# Patient Record
Sex: Male | Born: 1960
Health system: Southern US, Community
[De-identification: ages and names within clinical notes are randomized; demographics above are authoritative.]

## PROBLEM LIST (undated history)

## (undated) DIAGNOSIS — G4730 Sleep apnea, unspecified: Secondary | ICD-10-CM

## (undated) DIAGNOSIS — I10 Essential (primary) hypertension: Secondary | ICD-10-CM

## (undated) DIAGNOSIS — E119 Type 2 diabetes mellitus without complications: Secondary | ICD-10-CM

## (undated) HISTORY — PX: COLONOSCOPY: SHX174

## (undated) HISTORY — DX: Sleep apnea, unspecified: G47.30

## (undated) HISTORY — DX: Essential (primary) hypertension: I10

---

## 2000-05-26 HISTORY — PX: APPENDECTOMY: SHX54

## 2001-04-19 ENCOUNTER — Emergency Department (HOSPITAL_COMMUNITY): Admission: EM | Admit: 2001-04-19 | Discharge: 2001-04-19 | Payer: Self-pay | Admitting: Emergency Medicine

## 2001-04-19 ENCOUNTER — Encounter: Payer: Self-pay | Admitting: Gastroenterology

## 2001-04-19 ENCOUNTER — Inpatient Hospital Stay (HOSPITAL_COMMUNITY): Admission: RE | Admit: 2001-04-19 | Discharge: 2001-04-27 | Payer: Self-pay | Admitting: Gastroenterology

## 2001-04-19 ENCOUNTER — Encounter: Payer: Self-pay | Admitting: Emergency Medicine

## 2010-10-03 ENCOUNTER — Other Ambulatory Visit: Payer: Self-pay | Admitting: Family Medicine

## 2010-10-03 DIAGNOSIS — R221 Localized swelling, mass and lump, neck: Secondary | ICD-10-CM

## 2010-10-04 ENCOUNTER — Ambulatory Visit
Admission: RE | Admit: 2010-10-04 | Discharge: 2010-10-04 | Disposition: A | Discharge status: Home / Self Care | Payer: BC Managed Care – PPO | Source: Ambulatory Visit | Attending: Family Medicine | Admitting: Family Medicine

## 2010-10-04 DIAGNOSIS — R221 Localized swelling, mass and lump, neck: Secondary | ICD-10-CM

## 2012-08-16 ENCOUNTER — Emergency Department (HOSPITAL_COMMUNITY)
Admission: EM | Admit: 2012-08-16 | Discharge: 2012-08-16 | Disposition: A | Discharge status: Home / Self Care | Payer: BC Managed Care – PPO | Attending: Emergency Medicine | Admitting: Emergency Medicine

## 2012-08-16 ENCOUNTER — Emergency Department (HOSPITAL_COMMUNITY): Payer: BC Managed Care – PPO

## 2012-08-16 ENCOUNTER — Encounter (HOSPITAL_COMMUNITY): Payer: Self-pay | Admitting: Emergency Medicine

## 2012-08-16 DIAGNOSIS — S239XXA Sprain of unspecified parts of thorax, initial encounter: Secondary | ICD-10-CM | POA: Insufficient documentation

## 2012-08-16 DIAGNOSIS — S39012A Strain of muscle, fascia and tendon of lower back, initial encounter: Secondary | ICD-10-CM

## 2012-08-16 DIAGNOSIS — S233XXA Sprain of ligaments of thoracic spine, initial encounter: Secondary | ICD-10-CM

## 2012-08-16 DIAGNOSIS — E119 Type 2 diabetes mellitus without complications: Secondary | ICD-10-CM | POA: Insufficient documentation

## 2012-08-16 DIAGNOSIS — S0990XA Unspecified injury of head, initial encounter: Secondary | ICD-10-CM | POA: Insufficient documentation

## 2012-08-16 DIAGNOSIS — Z79899 Other long term (current) drug therapy: Secondary | ICD-10-CM | POA: Insufficient documentation

## 2012-08-16 DIAGNOSIS — S335XXA Sprain of ligaments of lumbar spine, initial encounter: Secondary | ICD-10-CM | POA: Insufficient documentation

## 2012-08-16 DIAGNOSIS — Y92410 Unspecified street and highway as the place of occurrence of the external cause: Secondary | ICD-10-CM | POA: Insufficient documentation

## 2012-08-16 DIAGNOSIS — R51 Headache: Secondary | ICD-10-CM | POA: Insufficient documentation

## 2012-08-16 DIAGNOSIS — Y9389 Activity, other specified: Secondary | ICD-10-CM | POA: Insufficient documentation

## 2012-08-16 HISTORY — DX: Type 2 diabetes mellitus without complications: E11.9

## 2012-08-16 MED ORDER — OXYCODONE-ACETAMINOPHEN 5-325 MG PO TABS: 2.0000 | ORAL_TABLET | ORAL | Status: DC | PRN

## 2012-08-16 MED ORDER — DIAZEPAM 5 MG PO TABS: 5.0000 mg | ORAL_TABLET | Freq: Four times a day (QID) | ORAL | Status: DC | PRN
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
## 2013-02-14 ENCOUNTER — Encounter: Payer: Self-pay | Admitting: Internal Medicine

## 2013-04-14 ENCOUNTER — Ambulatory Visit (AMBULATORY_SURGERY_CENTER): Payer: Self-pay

## 2013-04-14 VITALS — Ht 74.0 in | Wt 298.0 lb

## 2013-04-14 DIAGNOSIS — Z1211 Encounter for screening for malignant neoplasm of colon: Secondary | ICD-10-CM

## 2013-04-14 MED ORDER — SUPREP BOWEL PREP KIT 17.5-3.13-1.6 GM/177ML PO SOLN: 1.0000 | Freq: Once | ORAL | Status: DC

## 2013-04-19 ENCOUNTER — Encounter: Payer: Self-pay | Admitting: Internal Medicine

## 2013-04-29 ENCOUNTER — Ambulatory Visit (AMBULATORY_SURGERY_CENTER): Payer: BC Managed Care – PPO | Admitting: Internal Medicine

## 2013-04-29 ENCOUNTER — Encounter: Payer: Self-pay | Admitting: Internal Medicine

## 2013-04-29 VITALS — BP 123/62 | HR 70 | Temp 97.5°F | Resp 16 | Ht 74.0 in | Wt 298.0 lb

## 2013-04-29 DIAGNOSIS — D126 Benign neoplasm of colon, unspecified: Secondary | ICD-10-CM

## 2013-04-29 DIAGNOSIS — Z1211 Encounter for screening for malignant neoplasm of colon: Secondary | ICD-10-CM

## 2013-04-29 DIAGNOSIS — N402 Nodular prostate without lower urinary tract symptoms: Secondary | ICD-10-CM

## 2013-04-29 MED ORDER — SODIUM CHLORIDE 0.9 % IV SOLN: 500.0000 mL | INTRAVENOUS | Status: DC

## 2013-04-29 NOTE — Op Note (Addendum)
[facility] [address] [place], [address]   COLONOSCOPY PROCEDURE REPORT  PATIENT: Blake Curtis, Blake Curtis  MR#: [id number] BIRTHDATE: [birth year] , 52  yrs. old GENDER: Male ENDOSCOPIST: Iva Boop, MD, [facility] REFERRED [id number] Jacky Kindle, M.D. PROCEDURE DATE:  04/29/2013 PROCEDURE:   Colonoscopy with snare polypectomy First Screening Colonoscopy - Avg.  risk and is 50 yrs.  old or older Yes.  Prior Negative Screening - Now for repeat screening. N/A  History of Adenoma - Now for follow-up colonoscopy & has been > or = to 3 yrs.  N/A  Polyps Removed Today? Yes. ASA CLASS:   Class II INDICATIONS:average risk screening and first colonoscopy. MEDICATIONS: propofol (Diprivan) 300mg  IV, MAC sedation, administered by CRNA, and These medications were titrated to patient response per physician's verbal order  DESCRIPTION OF PROCEDURE:   After the risks benefits and alternatives of the procedure were thoroughly explained, informed consent was obtained.  A digital rectal exam revealed a small inferior midiline nodule on the prostate.   The LB CF-H180AL Loaner [id number]  endoscope was introduced through the anus and advanced to the cecum, which was identified by both the appendix and ileocecal valve. No adverse events experienced.   The quality of the prep was excellent using Suprep  The instrument was then slowly withdrawn as the colon was fully examined.  COLON FINDINGS: A sessile polyp measuring 4 mm in size was found in the transverse colon.  A polypectomy was performed with a cold snare.  The resection was complete and the polyp tissue was completely retrieved.   The colon mucosa was otherwise normal.   A right colon retroflexion was performed.  Retroflexed views revealed no abnormalities. The time to cecum=2 minutes 17 seconds. Withdrawal time=13 minutes 51 seconds.  The scope was withdrawn and the procedure completed. COMPLICATIONS: There were no  complications.  ENDOSCOPIC IMPRESSION: 1.   Sessile polyp measuring 4 mm in size was found in the transverse colon; polypectomy was performed with a cold snare 2.   The colon mucosa was otherwise normal - excellent prep - first colonoscopy 3.   Small inferior midline prostate nodule  RECOMMENDATIONS: 1.  Timing of repeat colonoscopy will be determined by pathology findings. 2.   Have prostate evaluated by urology unless already done. Patoent will discuss with Dr. Jacky Kindle.   eSigned:  Iva Boop, MD, [facility] 04/29/2013 10:09 AM Revised: 04/29/2013 10:09 AM  cc: Geoffry Paradise, MD and The Patient

## 2013-04-29 NOTE — Progress Notes (Signed)
Patient did not experience any of the following events: a burn prior to discharge; a fall within the facility; wrong site/side/patient/procedure/implant event; or a hospital transfer or hospital admission upon discharge from the facility. (G8907) Patient did not have preoperative order for IV antibiotic SSI prophylaxis. (G8918)  

## 2013-04-29 NOTE — Progress Notes (Signed)
Procedure ends, to recovery, report given and VSS. 

## 2013-04-29 NOTE — Patient Instructions (Addendum)
I found and removed one small polyp that looks benign.  I felt a small nodule on your prostate that should be checked by a urologist. Please contact Dr. Jacky Kindle about this to see if he agrees and he will help further.  I will let you know pathology results and when to have another routine colonoscopy by mail.  I appreciate the opportunity to care for you. Iva Boop, MD, FACG  YOU HAD AN ENDOSCOPIC PROCEDURE TODAY AT THE [facility]: Refer to the procedure report that was given to you for any specific questions about what was found during the examination.  If the procedure report does not answer your questions, please call your gastroenterologist to clarify.  If you requested that your care partner not be given the details of your procedure findings, then the procedure report has been included in a sealed envelope for you to review at your convenience later.  YOU SHOULD EXPECT: Some feelings of bloating in the abdomen. Passage of more gas than usual.  Walking can help get rid of the air that was put into your GI tract during the procedure and reduce the bloating. If you had a lower endoscopy (such as a colonoscopy or flexible sigmoidoscopy) you may notice spotting of blood in your stool or on the toilet paper. If you underwent a bowel prep for your procedure, then you may not have a normal bowel movement for a few days.  DIET: Your first meal following the procedure should be a light meal and then it is ok to progress to your normal diet.  A half-sandwich or bowl of soup is an example of a good first meal.  Heavy or fried foods are harder to digest and may make you feel nauseous or bloated.  Likewise meals heavy in dairy and vegetables can cause extra gas to form and this can also increase the bloating.  Drink plenty of fluids but you should avoid alcoholic beverages for 24 hours.  ACTIVITY: Your care partner should take you home directly after the procedure.  You should plan to take  it easy, moving slowly for the rest of the day.  You can resume normal activity the day after the procedure however you should NOT DRIVE or use heavy machinery for 24 hours (because of the sedation medicines used during the test).    SYMPTOMS TO REPORT IMMEDIATELY: A gastroenterologist can be reached at any hour.  During normal business hours, 8:30 AM to 5:00 PM Monday through Friday, call [phone].  After hours and on weekends, please call the GI answering service at [phone] who will take a message and have the physician on call contact you.   Following lower endoscopy (colonoscopy or flexible sigmoidoscopy):  Excessive amounts of blood in the stool  Significant tenderness or worsening of abdominal pains  Swelling of the abdomen that is new, acute  Fever of 100F or higher   FOLLOW UP: If any biopsies were taken you will be contacted by phone or by letter within the next 1-3 weeks.  Call your gastroenterologist if you have not heard about the biopsies in 3 weeks.  Our staff will call the home number listed on your records the next business day following your procedure to check on you and address any questions or concerns that you may have at that time regarding the information given to you following your procedure. This is a courtesy call and so if there is no answer at the home number and we  have not heard from you through the emergency physician on call, we will assume that you have returned to your regular daily activities without incident.  SIGNATURES/CONFIDENTIALITY: You and/or your care partner have signed paperwork which will be entered into your electronic medical record.  These signatures attest to the fact that that the information above on your After Visit Summary has been reviewed and is understood.  Full responsibility of the confidentiality of this discharge information lies with you and/or your care-partner.  Polyp-handout given  Repeat colonoscopy will be  determined by pathology.  Have prostate evaluated by urology.

## 2013-04-29 NOTE — Progress Notes (Signed)
Called to room to assist during endoscopic procedure.  Patient ID and intended procedure confirmed with present staff. Received instructions for my participation in the procedure from the performing physician.  

## 2013-05-02 ENCOUNTER — Telehealth: Payer: Self-pay

## 2013-05-02 NOTE — Telephone Encounter (Signed)
  Follow up Call-  Call back number 04/29/2013  Post procedure Call Back phone  # [phone]  Permission to leave phone message Yes     Patient questions:  Do you have a fever, pain , or abdominal swelling? no Pain Score  0 *  Have you tolerated food without any problems? yes  Have you been able to return to your normal activities? yes  Do you have any questions about your discharge instructions: Diet   no Medications  no Follow up visit  no  Do you have questions or concerns about your Care? no  Actions: * If pain score is 4 or above: No action needed, pain <4.  No problems per the pt. maw

## 2013-05-06 ENCOUNTER — Encounter: Payer: Self-pay | Admitting: Internal Medicine

## 2013-12-14 ENCOUNTER — Encounter: Payer: Self-pay | Admitting: *Deleted

## 2016-05-27 DIAGNOSIS — E119 Type 2 diabetes mellitus without complications: Secondary | ICD-10-CM | POA: Diagnosis not present

## 2016-11-24 ENCOUNTER — Other Ambulatory Visit: Payer: Self-pay | Admitting: Internal Medicine

## 2016-11-24 DIAGNOSIS — E119 Type 2 diabetes mellitus without complications: Secondary | ICD-10-CM | POA: Diagnosis not present

## 2016-11-24 DIAGNOSIS — G4730 Sleep apnea, unspecified: Secondary | ICD-10-CM | POA: Diagnosis not present

## 2016-11-24 DIAGNOSIS — E669 Obesity, unspecified: Secondary | ICD-10-CM | POA: Diagnosis not present

## 2016-11-24 DIAGNOSIS — K210 Gastro-esophageal reflux disease with esophagitis: Secondary | ICD-10-CM | POA: Diagnosis not present

## 2016-11-24 DIAGNOSIS — R634 Abnormal weight loss: Secondary | ICD-10-CM | POA: Diagnosis not present

## 2016-11-24 DIAGNOSIS — I10 Essential (primary) hypertension: Secondary | ICD-10-CM | POA: Diagnosis not present

## 2016-11-24 DIAGNOSIS — R591 Generalized enlarged lymph nodes: Secondary | ICD-10-CM | POA: Diagnosis not present

## 2016-11-24 DIAGNOSIS — Z6837 Body mass index (BMI) 37.0-37.9, adult: Secondary | ICD-10-CM | POA: Diagnosis not present

## 2016-11-24 DIAGNOSIS — R221 Localized swelling, mass and lump, neck: Secondary | ICD-10-CM

## 2016-11-24 DIAGNOSIS — R5383 Other fatigue: Secondary | ICD-10-CM | POA: Diagnosis not present

## 2016-12-01 ENCOUNTER — Other Ambulatory Visit (HOSPITAL_COMMUNITY): Payer: Self-pay | Admitting: Internal Medicine

## 2016-12-01 ENCOUNTER — Ambulatory Visit
Admission: RE | Admit: 2016-12-01 | Discharge: 2016-12-01 | Disposition: A | Discharge status: Home / Self Care | Payer: 59 | Source: Ambulatory Visit | Attending: Internal Medicine | Admitting: Internal Medicine

## 2016-12-01 DIAGNOSIS — R221 Localized swelling, mass and lump, neck: Secondary | ICD-10-CM

## 2016-12-01 DIAGNOSIS — R918 Other nonspecific abnormal finding of lung field: Secondary | ICD-10-CM | POA: Diagnosis not present

## 2016-12-01 DIAGNOSIS — R634 Abnormal weight loss: Secondary | ICD-10-CM

## 2016-12-01 MED ORDER — IOPAMIDOL (ISOVUE-300) INJECTION 61%
75.0000 mL | Freq: Once | INTRAVENOUS | Status: AC | PRN
  Administered 2016-12-01: 75 mL via INTRAVENOUS

## 2016-12-08 ENCOUNTER — Other Ambulatory Visit: Payer: Self-pay | Admitting: Radiology

## 2016-12-09 ENCOUNTER — Other Ambulatory Visit: Payer: Self-pay | Admitting: General Surgery

## 2016-12-10 ENCOUNTER — Ambulatory Visit (HOSPITAL_COMMUNITY)
Admission: RE | Admit: 2016-12-10 | Discharge: 2016-12-10 | Disposition: A | Discharge status: Home / Self Care | Payer: 59 | Source: Ambulatory Visit | Attending: Internal Medicine | Admitting: Internal Medicine

## 2016-12-10 ENCOUNTER — Encounter (HOSPITAL_COMMUNITY): Payer: Self-pay

## 2016-12-10 DIAGNOSIS — G4730 Sleep apnea, unspecified: Secondary | ICD-10-CM | POA: Insufficient documentation

## 2016-12-10 DIAGNOSIS — Z888 Allergy status to other drugs, medicaments and biological substances status: Secondary | ICD-10-CM | POA: Diagnosis not present

## 2016-12-10 DIAGNOSIS — I10 Essential (primary) hypertension: Secondary | ICD-10-CM | POA: Diagnosis not present

## 2016-12-10 DIAGNOSIS — Z833 Family history of diabetes mellitus: Secondary | ICD-10-CM | POA: Insufficient documentation

## 2016-12-10 DIAGNOSIS — R590 Localized enlarged lymph nodes: Secondary | ICD-10-CM | POA: Diagnosis not present

## 2016-12-10 DIAGNOSIS — R591 Generalized enlarged lymph nodes: Secondary | ICD-10-CM | POA: Insufficient documentation

## 2016-12-10 DIAGNOSIS — R221 Localized swelling, mass and lump, neck: Secondary | ICD-10-CM

## 2016-12-10 DIAGNOSIS — E119 Type 2 diabetes mellitus without complications: Secondary | ICD-10-CM | POA: Insufficient documentation

## 2016-12-10 LAB — CBC
HEMATOCRIT: 39.8 % (ref 39.0–52.0)
HEMOGLOBIN: 13.4 g/dL (ref 13.0–17.0)
MCH: 29.4 pg (ref 26.0–34.0)
MCHC: 33.7 g/dL (ref 30.0–36.0)
MCV: 87.3 fL (ref 78.0–100.0)
Platelets: 179 10*3/uL (ref 150–400)
RBC: 4.56 MIL/uL (ref 4.22–5.81)
RDW: 12.7 % (ref 11.5–15.5)
WBC: 3.4 10*3/uL — ABNORMAL LOW (ref 4.0–10.5)

## 2016-12-10 LAB — PROTIME-INR
INR: 0.97
Prothrombin Time: 12.8 seconds (ref 11.4–15.2)

## 2016-12-10 LAB — GLUCOSE, CAPILLARY: Glucose-Capillary: 121 mg/dL — ABNORMAL HIGH (ref 65–99)

## 2016-12-10 LAB — APTT: aPTT: 37 seconds — ABNORMAL HIGH (ref 24–36)

## 2016-12-10 MED ORDER — SODIUM CHLORIDE 0.9 % IV SOLN: INTRAVENOUS | Status: DC

## 2016-12-10 MED ORDER — LIDOCAINE HCL (PF) 1 % IJ SOLN
INTRAMUSCULAR | Status: AC
  Filled 2016-12-10: qty 30

## 2016-12-10 NOTE — Progress Notes (Signed)
Chief Complaint: left suboccipital LAD  Referring Physician:Dr. Burnard Bunting  Supervising Physician: Daryll Brod  Patient Status: [facility] - Out-pt  HPI: Blake Curtis is a 56 y.o. male who found an enlarged LN on the posterior aspect of his head several months ago.  He saw his PCP who ordered CT scans of his head, neck, and chest.  These scans revealed generalized lymphadenopathy concerning for lymphoma.  A request for a LN BX has been made.  The patient presents today for this procedure.  Past Medical History:  Past Medical History:  Diagnosis Date  . Diabetes mellitus without complication (HCC)    diet controlled  . Hypertension   . Sleep apnea     Past Surgical History:  Past Surgical History:  Procedure Laterality Date  . APPENDECTOMY  2002    Family History:  Family History  Problem Relation Age of Onset  . Diabetes Mother   . Colon cancer Neg Hx   . Pancreatic cancer Neg Hx   . Stomach cancer Neg Hx     Social History:  reports that he has never smoked. He has never used smokeless tobacco. He reports that he does not drink alcohol or use drugs.  Allergies:  Allergies  Allergen Reactions  . Losartan Other (See Comments)    ED    Medications: Medications reviewed in epic  Please HPI for pertinent positives, otherwise complete 10 system ROS negative.  Mallampati Score: MD Evaluation Airway: WNL Heart: WNL Abdomen: WNL Chest/ Lungs: WNL ASA  Classification: 2 Mallampati/Airway Score: One  Physical Exam: BP 131/84   Pulse 66   Temp 98.2 F (36.8 C) (Oral)   Resp 16   Ht 6\' 2"  (1.88 m)   Wt 289 lb (131.1 kg)   SpO2 98%   BMI 37.11 kg/m  Body mass index is 37.11 kg/m. General: pleasant, WD, WN black male who is laying in bed in NAD HEENT: head is normocephalic, atraumatic.  Sclera are noninjected.  PERRL.  Ears and nose without any masses or lesions.  Mouth is pink and moist Heart: regular, rate, and rhythm.  Normal s1,s2. No obvious murmurs,  gallops, or rubs noted.  Palpable radial and pedal pulses bilaterally Lungs: CTAB, no wheezes, rhonchi, or rales noted.  Respiratory effort nonlabored Lymph: large palpable left suboccipital LN palpable Psych: A&Ox3 with an appropriate affect.   Labs: Results for orders placed or performed during the hospital encounter of 12/10/16 (from the past 48 hour(s))  APTT upon arrival     Status: Abnormal   Collection Time: 12/10/16  6:00 AM  Result Value Ref Range   aPTT 37 (H) 24 - 36 seconds    Comment:        IF BASELINE aPTT IS ELEVATED, SUGGEST PATIENT RISK ASSESSMENT BE USED TO DETERMINE APPROPRIATE ANTICOAGULANT THERAPY.   CBC upon arrival     Status: Abnormal   Collection Time: 12/10/16  6:00 AM  Result Value Ref Range   WBC 3.4 (L) 4.0 - 10.5 K/uL   RBC 4.56 4.22 - 5.81 MIL/uL   Hemoglobin 13.4 13.0 - 17.0 g/dL   HCT 39.8 39.0 - 52.0 %   MCV 87.3 78.0 - 100.0 fL   MCH 29.4 26.0 - 34.0 pg   MCHC 33.7 30.0 - 36.0 g/dL   RDW 12.7 11.5 - 15.5 %   Platelets 179 150 - 400 K/uL  Protime-INR upon arrival     Status: None   Collection Time: 12/10/16  6:00 AM  Result Value Ref Range   Prothrombin Time 12.8 11.4 - 15.2 seconds   INR 0.97   Glucose, capillary     Status: Abnormal   Collection Time: 12/10/16  6:14 AM  Result Value Ref Range   Glucose-Capillary 121 (H) 65 - 99 mg/dL    Imaging: No results found.  Assessment/Plan 1. Lymphadenopathy  We will plan to proceed today with a LN BX.  The patient has requested the procedure to be done without sedation.  This will be done.  His labs and vitals have been reviewed. Risks and Benefits discussed with the patient including, but not limited to bleeding, infection, damage to adjacent structures or low yield requiring additional tests. All of the patient's questions were answered, patient is agreeable to proceed. Consent signed and in chart.  Thank you for this interesting consult.  I greatly enjoyed meeting Blake Curtis and  look forward to participating in their care.  A copy of this report was sent to the requesting provider on this date.  Electronically Signed: Henreitta Cea 12/10/2016, 8:05 AM   I spent a total of  30 Minutes   in face to face in clinical consultation, greater than 50% of which was counseling/coordinating care for lymphadenopathy

## 2016-12-10 NOTE — Discharge Instructions (Addendum)
Needle Biopsy, Care After °Refer to this sheet in the next few weeks. These instructions provide you with information about caring for yourself after your procedure. Your health care provider may also give you more specific instructions. Your treatment has been planned according to current medical practices, but problems sometimes occur. Call your health care provider if you have any problems or questions after your procedure. °What can I expect after the procedure? °After your procedure, it is common to have soreness, bruising, or mild pain at the biopsy site. This should go away in a few days. °Follow these instructions at home: °· Rest as directed by your health care provider. °· Take medicines only as directed by your health care provider. °· There are many different ways to close and cover the biopsy site, including stitches (sutures), skin glue, and adhesive strips. Follow your health care provider's instructions about: °? Biopsy site care. °? Bandage (dressing) changes and removal. °? Biopsy site closure removal. °· Check your biopsy site every day for signs of infection. Watch for: °? Redness, swelling, or pain. °? Fluid, blood, or pus. °Contact a health care provider if: °· You have a fever. °· You have redness, swelling, or pain at the biopsy site that lasts longer than a few days. °· You have fluid, blood, or pus coming from the biopsy site. °· You feel nauseous. °· You vomit. °Get help right away if: °· You have shortness of breath. °· You have trouble breathing. °· You have chest pain. °· You feel dizzy or you faint. °· You have bleeding that does not stop with pressure or a bandage. °· You cough up blood. °· You have pain in your abdomen. °This information is not intended to replace advice given to you by your health care provider. Make sure you discuss any questions you have with your health care provider. °Document Released: 09/26/2014 Document Revised: 10/18/2015 Document Reviewed:  05/08/2014 °Elsevier Interactive Patient Education © 2018 [employer] °Moderate Conscious Sedation, Adult, Care After °These instructions provide you with information about caring for yourself after your procedure. Your health care provider may also give you more specific instructions. Your treatment has been planned according to current medical practices, but problems sometimes occur. Call your health care provider if you have any problems or questions after your procedure. °What can I expect after the procedure? °After your procedure, it is common: °· To feel sleepy for several hours. °· To feel clumsy and have poor balance for several hours. °· To have poor judgment for several hours. °· To vomit if you eat too soon. ° °Follow these instructions at home: °For at least 24 hours after the procedure: ° °· Do not: °? Participate in activities where you could fall or become injured. °? Drive. °? Use heavy machinery. °? Drink alcohol. °? Take sleeping pills or medicines that cause drowsiness. °? Make important decisions or sign legal documents. °? Take care of children on your own. °· Rest. °Eating and drinking °· Follow the diet recommended by your health care provider. °· If you vomit: °? Drink water, juice, or soup when you can drink without vomiting. °? Make sure you have little or no nausea before eating solid foods. °General instructions °· Have a responsible adult stay with you until you are awake and alert. °· Take over-the-counter and prescription medicines only as told by your health care provider. °· If you smoke, do not smoke without supervision. °· Keep all follow-up visits as told by your health care   provider. This is important. °Contact a health care provider if: °· You keep feeling nauseous or you keep vomiting. °· You feel light-headed. °· You develop a rash. °· You have a fever. °Get help right away if: °· You have trouble breathing. °This information is not intended to replace advice given to you  by your health care provider. Make sure you discuss any questions you have with your health care provider. °Document Released: 03/02/2013 Document Revised: 10/15/2015 Document Reviewed: 09/01/2015 °Elsevier Interactive Patient Education © 2018 [employer] ° °

## 2016-12-10 NOTE — Procedures (Signed)
cervcial adenopathy  S/p LT CERVICAL LN CORE BX  No comp Stable Path pending EBL 0 Full report in PACS

## 2016-12-15 ENCOUNTER — Telehealth: Payer: Self-pay | Admitting: Pulmonary Disease

## 2016-12-15 NOTE — Telephone Encounter (Signed)
Per BQ - change pt's consult appt to see BQ on 12/17/16 at 0900.   Appt change to 7.25.18 at 0900. Called and informed pt and advised him to arrive no later than 0830 (pt is already aware of our location). Pt verbalized understanding and denied any further questions or concerns at this time. BQ aware.

## 2016-12-16 ENCOUNTER — Institutional Professional Consult (permissible substitution): Payer: 59 | Admitting: Pulmonary Disease

## 2016-12-17 ENCOUNTER — Ambulatory Visit (INDEPENDENT_AMBULATORY_CARE_PROVIDER_SITE_OTHER): Payer: 59 | Admitting: Pulmonary Disease

## 2016-12-17 ENCOUNTER — Encounter: Payer: Self-pay | Admitting: Pulmonary Disease

## 2016-12-17 VITALS — BP 134/80 | HR 74 | Ht 74.0 in | Wt 282.0 lb

## 2016-12-17 DIAGNOSIS — R911 Solitary pulmonary nodule: Secondary | ICD-10-CM | POA: Diagnosis not present

## 2016-12-17 DIAGNOSIS — D869 Sarcoidosis, unspecified: Secondary | ICD-10-CM

## 2016-12-17 NOTE — Progress Notes (Signed)
Subjective:    Patient ID: Blake Curtis, male    DOB: 21-Apr-1961, 56 y.o.   MRN: [id number]  Synopsis: Referred in July 2018 for knee diagnosis of sarcoidosis.  HPI Chief Complaint  Patient presents with  . Advice Only    Referral for mediastinal lymphadenopathy.     Blake Curtis is here to see me because he was found to have enlarged lymph nodes.  He denies any symtpoms like dyspnea, pain, or fatigue at the time. He had lost some weight prior and this made the lymph node was more obvious in his neck because it was enlarged.    No family history of lung problems, he is a non-smoker.  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  He sometimes wears a mask.  He works with powders and fumes. He says that he does not wear a mask.  He has never smoked. He said that he lost the weight intentionally. He denies rash or joint pain.    Past Medical History:  Diagnosis Date  . Diabetes mellitus without complication (HCC)    diet controlled  . Hypertension   . Sleep apnea      Family History  Problem Relation Age of Onset  . Diabetes Mother   . Colon cancer Neg Hx   . Pancreatic cancer Neg Hx   . Stomach cancer Neg Hx      Social History   Social History  . Marital status: Married    Spouse name: N/A  . Number of children: N/A  . Years of education: N/A   Occupational History  . Not on file.   Social History Main Topics  . Smoking status: Never Smoker  . Smokeless tobacco: Never Used  . Alcohol use No  . Drug use: No  . Sexual activity: Not on file   Other Topics Concern  . Not on file   Social History Narrative   History of Smoking Cigarettes: Never Smoked   No Alcohol   No recreational drug use   Marital Status: Widowed. Wife died 2008-08-09 from ovarian cancer   Children: None     Allergies  Allergen Reactions  . Losartan Other (See Comments)    cough     Outpatient Medications Prior to Visit  Medication Sig Dispense Refill  . aspirin 81 MG  tablet Take 81 mg by mouth daily.    . metFORMIN (GLUCOPHAGE) 500 MG tablet Take 500 mg by mouth daily with breakfast.     No facility-administered medications prior to visit.       Review of Systems  Constitutional: Negative for fever and unexpected weight change.  HENT: Negative for congestion, dental problem, ear pain, nosebleeds, postnasal drip, rhinorrhea, sinus pressure, sneezing, sore throat and trouble swallowing.   Eyes: Negative for redness and itching.  Respiratory: Negative for cough, chest tightness, shortness of breath and wheezing.   Cardiovascular: Negative for palpitations and leg swelling.  Gastrointestinal: Negative for nausea and vomiting.  Genitourinary: Negative for dysuria.  Musculoskeletal: Negative for joint swelling.  Skin: Negative for rash.  Neurological: Negative for headaches.  Hematological: Does not bruise/bleed easily.  Psychiatric/Behavioral: Negative for dysphoric mood. The patient is not nervous/anxious.        Objective:   Physical Exam Vitals:   12/17/16 0852  BP: 134/80  Pulse: 74  SpO2: 100%  Weight: 282 lb (127.9 kg)  Height: 6\' 2"  (1.88 m)   Gen: well appearing, no acute distress HENT: NCAT,  OP clear, neck supple without masses Eyes: PERRL, EOMi Lymph: no cervical lymphadenopathy PULM: CTA B CV: RRR, no mgr, no JVD GI: BS+, soft, nontender, no hsm Derm: no rash or skin breakdown MSK: normal bulk and tone Neuro: A&Ox4, CN II-XII intact, strength 5/5 in all 4 extremities Psyche: normal mood and affect   Pathology: 12/10/2016 lymph node needle aspiration 9 Casey granulomas, special stains negative for organisms   Interventional radiology records reviewed: He was discovered to have an enlarged lymph node in the posterior aspect of his head several months prior to a biopsy which was performed on 12/10/2016 by Dr. Reesa Chew.  Chest imaging: 2018 CT chest images independently reviewed showing diffuse mediastinal and hilar  adenopathy, some small scattered pulmonary nodules but no pulmonary parenchymal abnormality    Assessment & Plan:  Sarcoidosis - Plan: EKG 12-Lead, Pulmonary function test, CT Chest Wo Contrast  Pulmonary nodule  Discussion: Blake Curtis is here today with a new diagnosis of sarcoidosis. He has lymphadenopathy, some small pulmonary nodules, and a biopsy which shows noncaseating granulomas without evidence of infection on special stain. There is no indication of a pulmonary fibrotic process though he has some small pulmonary nodules. Fortunately, he is asymptomatic so there is no reason to treat with prednisone right now.  We spent a significant amount of time counseling him today on the effects of sarcoidosis and discussing the underlying etiology and natural history. I explained to him that there is a 90+ percent chance that this will not worsen that we need to watch out for signs such as fevers, chills, shortness of breath, or unexplained cough. Also talked about rash, heart involvement and I involvement. Asking to see an ophthalmologist.  Plan: For your sarcoidosis: We will get a lung function test We will get a 12-lead EKG Let us know if you have or unexplained weight loss, fevers, chills, chest pain, cough, or shortness of breath We will see you back in one year and we'll repeat a CT scan and the lung function test Wear a mask when at work  We will see back in one year or sooner if needed   Current Outpatient Prescriptions:  .  aspirin 81 MG tablet, Take 81 mg by mouth daily., Disp: , Rfl:  .  metFORMIN (GLUCOPHAGE) 500 MG tablet, Take 500 mg by mouth daily with breakfast., Disp: , Rfl:

## 2016-12-17 NOTE — Patient Instructions (Signed)
For your sarcoidosis: We will get a lung function test We will get a 12-lead EKG Let us know if you have or unexplained weight loss, fevers, chills, chest pain, cough, or shortness of breath We will see you back in one year and we'll repeat a CT scan and the lung function test Wear a mask when at work  We will see back in one year or sooner if needed

## 2017-01-01 ENCOUNTER — Ambulatory Visit (INDEPENDENT_AMBULATORY_CARE_PROVIDER_SITE_OTHER): Payer: 59 | Admitting: Pulmonary Disease

## 2017-01-01 DIAGNOSIS — D869 Sarcoidosis, unspecified: Secondary | ICD-10-CM | POA: Diagnosis not present

## 2017-01-01 LAB — PULMONARY FUNCTION TEST
DL/VA % pred: 77 %
DL/VA: 3.76 ml/min/mmHg/L
DLCO COR: 24.85 ml/min/mmHg
DLCO cor % pred: 65 %
DLCO unc % pred: 63 %
DLCO unc: 24.04 ml/min/mmHg
FEF 25-75 Post: 3.19 L/sec
FEF 25-75 Pre: 3.69 L/sec
FEF2575-%CHANGE-POST: -13 %
FEF2575-%PRED-POST: 87 %
FEF2575-%Pred-Pre: 101 %
FEV1-%CHANGE-POST: -6 %
FEV1-%PRED-PRE: 86 %
FEV1-%Pred-Post: 80 %
FEV1-PRE: 3.72 L
FEV1-Post: 3.48 L
FEV1FVC-%CHANGE-POST: -10 %
FEV1FVC-%Pred-Pre: 103 %
FEV6-%Change-Post: 1 %
FEV6-%PRED-PRE: 85 %
FEV6-%Pred-Post: 86 %
FEV6-PRE: 4.62 L
FEV6-Post: 4.68 L
FEV6FVC-%Change-Post: 1 %
FEV6FVC-%PRED-PRE: 102 %
FEV6FVC-%Pred-Post: 104 %
FVC-%Change-Post: 4 %
FVC-%PRED-POST: 86 %
FVC-%PRED-PRE: 83 %
FVC-POST: 4.89 L
FVC-PRE: 4.68 L
POST FEV6/FVC RATIO: 100 %
Post FEV1/FVC ratio: 71 %
Pre FEV1/FVC ratio: 80 %
Pre FEV6/FVC Ratio: 99 %
RV % PRED: 80 %
RV: 1.9 L
TLC % pred: 87 %
TLC: 6.8 L

## 2017-01-01 NOTE — Progress Notes (Signed)
PFT done today. 

## 2017-01-06 ENCOUNTER — Telehealth: Payer: Self-pay | Admitting: Pulmonary Disease

## 2017-01-06 NOTE — Telephone Encounter (Signed)
Called and spoke to pt's wife, Velva Harman. She is requesting the results of pt's PFT results from 8.9.2018. Velva Harman is aware that BQ is out of the office until 01/19/17 and if ok waiting for results until BQ returns.   Dr. Lake Bells please advise. Thanks.

## 2017-01-09 NOTE — Telephone Encounter (Signed)
Pt's wife is aware of MW's below message and voiced her understanding. Nothing further needed.

## 2017-01-09 NOTE — Telephone Encounter (Signed)
Really nothing of significance - very minor abnormalities that could be due to sarcoid but as per Dr Lake Bells no indication for treatment or change from what Dr Lake Bells already told him and will likely say the same thing when he reviews it himself

## 2017-01-09 NOTE — Telephone Encounter (Signed)
Called and spoke with Velva Harman and she stated that if someone else could look at the PFT so they can know if everything is ok.  Will forward to MW to review this morning.  Thanks

## 2017-01-19 ENCOUNTER — Telehealth: Payer: Self-pay | Admitting: Pulmonary Disease

## 2017-01-19 DIAGNOSIS — D869 Sarcoidosis, unspecified: Secondary | ICD-10-CM

## 2017-01-19 NOTE — Telephone Encounter (Signed)
I discussed the results of his PFT with his wife which showed reduced diffusion capacity.  I would like for him to have an echo, will cc triage to facilitate.

## 2017-01-19 NOTE — Addendum Note (Signed)
Addended by: Len Blalock on: 01/19/2017 04:59 PM   Modules accepted: Orders

## 2017-01-19 NOTE — Telephone Encounter (Signed)
Echo ordered.  Nothing further needed.

## 2017-01-28 ENCOUNTER — Other Ambulatory Visit (HOSPITAL_COMMUNITY): Payer: 59

## 2017-03-04 ENCOUNTER — Telehealth: Payer: Self-pay | Admitting: Pulmonary Disease

## 2017-03-04 NOTE — Telephone Encounter (Signed)
Spoke with wife, and advised her to call [facility]. Gave address and phone number for her to call and reschedule at their convenience. Nothing further is needed.

## 2017-03-09 ENCOUNTER — Telehealth: Payer: Self-pay | Admitting: Pulmonary Disease

## 2017-03-09 DIAGNOSIS — I10 Essential (primary) hypertension: Secondary | ICD-10-CM

## 2017-03-09 DIAGNOSIS — D869 Sarcoidosis, unspecified: Secondary | ICD-10-CM

## 2017-03-09 NOTE — Telephone Encounter (Signed)
Spoke with Blake Curtis. She stated that [facility] will need a new referral since the original order has now expired.   Advised her that I would place another order. She verbalized understanding. Nothing further needed at time of call.

## 2017-03-20 ENCOUNTER — Other Ambulatory Visit (HOSPITAL_COMMUNITY): Payer: 59

## 2017-03-26 DIAGNOSIS — Z125 Encounter for screening for malignant neoplasm of prostate: Secondary | ICD-10-CM | POA: Diagnosis not present

## 2017-03-26 DIAGNOSIS — E119 Type 2 diabetes mellitus without complications: Secondary | ICD-10-CM | POA: Diagnosis not present

## 2017-03-26 DIAGNOSIS — Z0000 Encounter for general adult medical examination without abnormal findings: Secondary | ICD-10-CM | POA: Diagnosis not present

## 2017-04-01 DIAGNOSIS — Z1389 Encounter for screening for other disorder: Secondary | ICD-10-CM | POA: Diagnosis not present

## 2017-04-01 DIAGNOSIS — E119 Type 2 diabetes mellitus without complications: Secondary | ICD-10-CM | POA: Diagnosis not present

## 2017-04-01 DIAGNOSIS — I10 Essential (primary) hypertension: Secondary | ICD-10-CM | POA: Diagnosis not present

## 2017-04-01 DIAGNOSIS — E669 Obesity, unspecified: Secondary | ICD-10-CM | POA: Diagnosis not present

## 2017-04-01 DIAGNOSIS — K210 Gastro-esophageal reflux disease with esophagitis: Secondary | ICD-10-CM | POA: Diagnosis not present

## 2017-04-01 DIAGNOSIS — M545 Low back pain: Secondary | ICD-10-CM | POA: Diagnosis not present

## 2017-04-01 DIAGNOSIS — D869 Sarcoidosis, unspecified: Secondary | ICD-10-CM | POA: Diagnosis not present

## 2017-04-01 DIAGNOSIS — Z0000 Encounter for general adult medical examination without abnormal findings: Secondary | ICD-10-CM | POA: Diagnosis not present

## 2017-04-01 DIAGNOSIS — G4730 Sleep apnea, unspecified: Secondary | ICD-10-CM | POA: Diagnosis not present

## 2017-04-01 DIAGNOSIS — N401 Enlarged prostate with lower urinary tract symptoms: Secondary | ICD-10-CM | POA: Diagnosis not present

## 2017-04-23 ENCOUNTER — Ambulatory Visit (HOSPITAL_COMMUNITY): Payer: 59 | Attending: Internal Medicine

## 2017-04-23 ENCOUNTER — Other Ambulatory Visit: Payer: Self-pay

## 2017-04-23 DIAGNOSIS — I10 Essential (primary) hypertension: Secondary | ICD-10-CM | POA: Insufficient documentation

## 2017-04-23 DIAGNOSIS — D869 Sarcoidosis, unspecified: Secondary | ICD-10-CM | POA: Diagnosis not present

## 2017-04-24 NOTE — Progress Notes (Signed)
Spoke with patient and made him aware of results. He verbalized understanding and did not have any questions. Nothing further is needed.

## 2017-05-07 MED FILL — TAMSULOSIN HCL 0.4 MG CAP: 0.4 | 30 days supply | Qty: 30 | Fill #0

## 2017-05-12 MED FILL — metFORMIN HCL 500 MG TABS: 500 | 90 days supply | Qty: 90 | Fill #0

## 2017-06-17 MED FILL — TAMSULOSIN HCL 0.4 MG CAP: 0.4 | 30 days supply | Qty: 30 | Fill #1

## 2017-08-03 DIAGNOSIS — E119 Type 2 diabetes mellitus without complications: Secondary | ICD-10-CM | POA: Diagnosis not present

## 2017-08-03 DIAGNOSIS — I10 Essential (primary) hypertension: Secondary | ICD-10-CM | POA: Diagnosis not present

## 2017-08-03 DIAGNOSIS — K210 Gastro-esophageal reflux disease with esophagitis: Secondary | ICD-10-CM | POA: Diagnosis not present

## 2017-08-03 DIAGNOSIS — M545 Low back pain: Secondary | ICD-10-CM | POA: Diagnosis not present

## 2017-08-03 DIAGNOSIS — Z1389 Encounter for screening for other disorder: Secondary | ICD-10-CM | POA: Diagnosis not present

## 2017-08-03 DIAGNOSIS — D869 Sarcoidosis, unspecified: Secondary | ICD-10-CM | POA: Diagnosis not present

## 2017-08-03 DIAGNOSIS — E669 Obesity, unspecified: Secondary | ICD-10-CM | POA: Diagnosis not present

## 2017-08-03 DIAGNOSIS — G4730 Sleep apnea, unspecified: Secondary | ICD-10-CM | POA: Diagnosis not present

## 2017-08-03 DIAGNOSIS — N401 Enlarged prostate with lower urinary tract symptoms: Secondary | ICD-10-CM | POA: Diagnosis not present

## 2017-08-03 MED FILL — TAMSULOSIN HCL 0.4 MG CAP: 0.4 | 30 days supply | Qty: 30 | Fill #2

## 2017-08-04 MED FILL — FINASTERIDE 5 MG TABLET: 5 | 30 days supply | Qty: 30 | Fill #0

## 2017-09-11 MED FILL — FINASTERIDE 5 MG TABLET: 5 | 30 days supply | Qty: 30 | Fill #1

## 2017-09-11 MED FILL — TAMSULOSIN HCL 0.4 MG CAP: 0.4 | 30 days supply | Qty: 30 | Fill #3

## 2017-11-24 ENCOUNTER — Ambulatory Visit (INDEPENDENT_AMBULATORY_CARE_PROVIDER_SITE_OTHER)
Admission: RE | Admit: 2017-11-24 | Discharge: 2017-11-24 | Disposition: A | Discharge status: Home / Self Care | Payer: 59 | Source: Ambulatory Visit | Attending: Pulmonary Disease | Admitting: Pulmonary Disease

## 2017-11-24 DIAGNOSIS — D869 Sarcoidosis, unspecified: Secondary | ICD-10-CM | POA: Diagnosis not present

## 2017-11-24 DIAGNOSIS — R918 Other nonspecific abnormal finding of lung field: Secondary | ICD-10-CM | POA: Diagnosis not present

## 2017-11-24 NOTE — Progress Notes (Signed)
LMTCB

## 2017-12-03 MED FILL — TAMSULOSIN HCL 0.4 MG CAP: 0.4 | 30 days supply | Qty: 30 | Fill #4

## 2017-12-03 MED FILL — FINASTERIDE 5 MG TABLET: 5 | 30 days supply | Qty: 30 | Fill #2

## 2017-12-18 ENCOUNTER — Ambulatory Visit: Payer: 59 | Admitting: Pulmonary Disease

## 2018-01-12 ENCOUNTER — Other Ambulatory Visit: Payer: 59

## 2018-01-12 ENCOUNTER — Ambulatory Visit (INDEPENDENT_AMBULATORY_CARE_PROVIDER_SITE_OTHER): Payer: 59 | Admitting: Pulmonary Disease

## 2018-01-12 ENCOUNTER — Encounter: Payer: Self-pay | Admitting: Pulmonary Disease

## 2018-01-12 VITALS — BP 138/90 | HR 84 | Ht 72.64 in | Wt 290.0 lb

## 2018-01-12 DIAGNOSIS — R591 Generalized enlarged lymph nodes: Secondary | ICD-10-CM

## 2018-01-12 DIAGNOSIS — M542 Cervicalgia: Secondary | ICD-10-CM | POA: Diagnosis not present

## 2018-01-12 DIAGNOSIS — D869 Sarcoidosis, unspecified: Secondary | ICD-10-CM

## 2018-01-12 MED ORDER — HYDROXYCHLOROQUINE SULFATE 200 MG PO TABS: 200.0000 mg | ORAL_TABLET | Freq: Every day | ORAL | 0 refills | Status: DC

## 2018-01-12 NOTE — Patient Instructions (Signed)
Lymphadenopathy due to sarcoidosis causing neck pain: On days when the pain is more severe use warm compresses and naproxen over-the-counter Take Plaquenil 200 mg daily for the next 3 months Prior to starting Plaquenil we need to check one simple blood test, do not fill the prescription until you have heard the results from this  Pulmonary sarcoidosis: We will check a lung function test  Follow-up in 3 months or sooner if needed

## 2018-01-12 NOTE — Progress Notes (Signed)
Subjective:    Patient ID: Blake Curtis, male    DOB: [birth year], 57 y.o.   MRN: [id number]  Synopsis: Referred in July 2018 for new diagnosis of sarcoidosis.  HPI Chief Complaint  Patient presents with  . Follow-up    pt reports neck pain   [name] comes back for follow-up today with his known diagnosis of pulmonary sarcoidosis: He says that he has not had any problems breathing in the last year.  No bronchitis, no pneumonia, no problems with shortness of breath.  No cough, no chest tightness.  However, he says that he has neck pain on a day-to-day basis which she attributes to the "lumps" in his neck.  Specifically he says the lymphadenopathy is quite painful, typically worse in the mornings.  Is worse when he turns his neck.  He denies any weakness in his hands or numbness.  He does not have neck pain apart from this.  He says that warm compresses help.  Naproxen helps.   Past Medical History:  Diagnosis Date  . Diabetes mellitus without complication (HCC)    diet controlled  . Hypertension   . Sleep apnea      Family History  Problem Relation Age of Onset  . Diabetes Mother   . Colon cancer Neg Hx   . Pancreatic cancer Neg Hx   . Stomach cancer Neg Hx        Review of Systems  Constitutional: Negative for fever and unexpected weight change.  HENT: Negative for congestion, dental problem, ear pain, nosebleeds, postnasal drip, rhinorrhea, sinus pressure, sneezing, sore throat and trouble swallowing.   Eyes: Negative for redness and itching.  Respiratory: Negative for cough, chest tightness, shortness of breath and wheezing.   Cardiovascular: Negative for palpitations and leg swelling.  Gastrointestinal: Negative for nausea and vomiting.  Genitourinary: Negative for dysuria.  Musculoskeletal: Negative for joint swelling.  Skin: Negative for rash.  Neurological: Negative for headaches.  Hematological: Does not bruise/bleed easily.  Psychiatric/Behavioral: Negative for  dysphoric mood. The patient is not nervous/anxious.        Objective:   Physical Exam Vitals:   01/12/18 1644  BP: 138/90  Pulse: 84  SpO2: 100%  Weight: 290 lb (131.5 kg)  Height: 6' 0.64" (1.845 m)    Gen: well appearing HENT: OP clear, TM's clear, neck with occipital and posterior cervical lymphadenopathy PULM: CTA B, normal percussion CV: RRR, no mgr, trace edema GI: BS+, soft, nontender Derm: no cyanosis or rash Psyche: normal mood and affect    Pathology: 12/10/2016 lymph node needle aspiration non-caseating granulomas, special stains negative for organisms  PFT:  August 2018 ratio normal, FVC 4.89 L 86% predicted, total lung capacity 6.80 L 87% predicted, DLCO 24.0 463%  Interventional radiology records reviewed: He was discovered to have an enlarged lymph node in the posterior aspect of his head several months prior to a biopsy which was performed on 12/10/2016 by Dr. Reesa Chew.  Chest imaging: 2018 CT chest images independently reviewed showing diffuse mediastinal and hilar adenopathy, some small scattered pulmonary nodules but no pulmonary parenchymal abnormality 2019 CT chest images independently reviewed showing stable scattered small nodules in both lungs, adenopathy stable    Assessment & Plan:  Sarcoidosis - Plan: Pulmonary function test, Glucose 6 phosphate dehydrogenase  Neck pain  Lymphadenopathy Discussion: Virgilio states that the lymphadenopathy in his neck is his biggest problem.  He has palpable lymphadenopathy on exam which we know is due to his sarcoidosis based on the  biopsy from a year ago.  Fortunately there does not seem to be evidence of otherwise significant organ threatening or life-threatening sarcoidosis.  I would like to get another lung function test to make sure that the small nodules that were seen earlier this year or not reflective of worsening parenchymal disease, I doubt it based on his lack of symptoms.  Given the fact that he has  day-to-day bothersome symptoms from his lymphadenopathy I think it is appropriate to treat him.  He is not interested in steroids which I think is reasonable considering his obesity and the likelihood of this causing more problems with long-term use.  He is also not interested in taking long-term NSAIDs because of the side effects.  So after lengthy discussion I think the best approach is to start him on a low-dose of Plaquenil.  I would like to treat him with this for about 3 months and then reassess its need.  Plan: Lymphadenopathy due to sarcoidosis causing neck pain: On days when the pain is more severe use warm compresses and naproxen over-the-counter Take Plaquenil 200 mg daily for the next 3 months Prior to starting Plaquenil we need to check G6PD testing, do not fill the prescription until you have heard the results from this  Pulmonary sarcoidosis: We will check a lung function test  Follow-up in 3 months or sooner if needed   Current Outpatient Medications:  .  aspirin 81 MG tablet, Take 81 mg by mouth daily., Disp: , Rfl:  .  metFORMIN (GLUCOPHAGE) 500 MG tablet, Take 500 mg by mouth daily with breakfast., Disp: , Rfl:  .  tamsulosin (FLOMAX) 0.4 MG CAPS capsule, Take 0.4 mg by mouth daily., Disp: , Rfl: 5 .  hydroxychloroquine (PLAQUENIL) 200 MG tablet, Take 1 tablet (200 mg total) by mouth daily., Disp: 2 tablet, Rfl: 0

## 2018-01-13 LAB — GLUCOSE 6 PHOSPHATE DEHYDROGENASE: G-6PDH: 13.7 U/g Hgb (ref 7.0–20.5)

## 2018-01-14 ENCOUNTER — Telehealth: Payer: Self-pay | Admitting: Pulmonary Disease

## 2018-01-14 NOTE — Telephone Encounter (Signed)
Called and spoke to patient. Relayed results per Dr. Lake Bells. Patient verbalized understanding. Nothing further needed.    Result Notes for Glucose 6 phosphate dehydrogenase   Notes recorded by Juanito Doom, MD on 01/14/2018 at 4:13 PM EDT BJ, Please let the patient know this was OK, he can start taking plaquenil Thanks, B

## 2018-01-18 ENCOUNTER — Telehealth: Payer: Self-pay | Admitting: Pulmonary Disease

## 2018-01-18 NOTE — Telephone Encounter (Signed)
   Dr. Lake Bells this medication is taken once daily? I just want to verify before I sent in. I spoke with pt and advised him that I wanted to verify the quantity. BQ please advise.    hydroxychloroquine (PLAQUENIL) 200 MG tablet [[id number]]    Order Details  Dose: 200 mg Route: Oral Frequency: Daily  Dispense Quantity: 2 tablet Refills: 0 Fills remaining: --        Sig: Take 1 tablet (200 mg total) by mouth daily.       Written Date: 01/12/18 Expiration Date: 01/12/19    Start Date: 01/12/18 End Date: 02/11/18 after 30 doses         Ordering Provider: -- DEA #:  -- NPI:  --   Authorizing Provider: Juanito Doom, MD DEA #:  [id number] NPI:  [id number]   Ordering User:  Juanito Doom, MD            Pharmacy:  [place], [place] - [address] DEA #:  --    Pharmacy Comments:  --       Fill quantity remaining:  -- Fill quantity used:  --

## 2018-01-20 MED ORDER — HYDROXYCHLOROQUINE SULFATE 200 MG PO TABS: 200.0000 mg | ORAL_TABLET | Freq: Every day | ORAL | 2 refills | Status: AC

## 2018-01-20 MED FILL — HYDROXYCHLOROQUINE SULFATE: 200 | 30 days supply | Qty: 30 | Fill #0

## 2018-01-20 NOTE — Telephone Encounter (Signed)
Medication has been sent in. Will close this encounter.  

## 2018-01-20 NOTE — Telephone Encounter (Signed)
Yes 200 mg daily

## 2018-02-01 MED FILL — TAMSULOSIN HCL 0.4 MG CAP: 0.4 | 30 days supply | Qty: 30 | Fill #5

## 2018-03-10 ENCOUNTER — Encounter: Payer: Self-pay | Admitting: Pulmonary Disease

## 2018-03-10 ENCOUNTER — Ambulatory Visit (INDEPENDENT_AMBULATORY_CARE_PROVIDER_SITE_OTHER): Payer: 59 | Admitting: Pulmonary Disease

## 2018-03-10 ENCOUNTER — Other Ambulatory Visit (INDEPENDENT_AMBULATORY_CARE_PROVIDER_SITE_OTHER): Payer: 59

## 2018-03-10 VITALS — BP 138/86 | HR 88

## 2018-03-10 DIAGNOSIS — R591 Generalized enlarged lymph nodes: Secondary | ICD-10-CM

## 2018-03-10 DIAGNOSIS — D869 Sarcoidosis, unspecified: Secondary | ICD-10-CM | POA: Diagnosis not present

## 2018-03-10 LAB — PULMONARY FUNCTION TEST
DL/VA % pred: 80 %
DL/VA: 3.89 ml/min/mmHg/L
DLCO UNC % PRED: 72 %
DLCO unc: 26.75 ml/min/mmHg
FEF 25-75 PRE: 4.3 L/s
FEF 25-75 Post: 4.96 L/sec
FEF2575-%CHANGE-POST: 15 %
FEF2575-%Pred-Post: 140 %
FEF2575-%Pred-Pre: 121 %
FEV1-%Change-Post: 3 %
FEV1-%PRED-POST: 101 %
FEV1-%Pred-Pre: 98 %
FEV1-POST: 4.25 L
FEV1-PRE: 4.12 L
FEV1FVC-%CHANGE-POST: 2 %
FEV1FVC-%Pred-Pre: 106 %
FEV6-%CHANGE-POST: 0 %
FEV6-%PRED-POST: 95 %
FEV6-%Pred-Pre: 95 %
FEV6-POST: 5.05 L
FEV6-Pre: 5.02 L
FEV6FVC-%Change-Post: 0 %
FEV6FVC-%PRED-POST: 103 %
FEV6FVC-%PRED-PRE: 103 %
FVC-%CHANGE-POST: 0 %
FVC-%Pred-Post: 92 %
FVC-%Pred-Pre: 92 %
FVC-Post: 5.07 L
FVC-Pre: 5.05 L
POST FEV6/FVC RATIO: 100 %
PRE FEV1/FVC RATIO: 81 %
Post FEV1/FVC ratio: 84 %
Pre FEV6/FVC Ratio: 99 %
RV % PRED: 79 %
RV: 1.85 L
TLC % PRED: 89 %
TLC: 6.82 L

## 2018-03-10 LAB — CBC WITH DIFFERENTIAL/PLATELET
Basophils Absolute: 0 10*3/uL (ref 0.0–0.1)
Basophils Relative: 0.6 % (ref 0.0–3.0)
EOS PCT: 5 % (ref 0.0–5.0)
Eosinophils Absolute: 0.2 10*3/uL (ref 0.0–0.7)
HEMATOCRIT: 42.3 % (ref 39.0–52.0)
HEMOGLOBIN: 14.5 g/dL (ref 13.0–17.0)
LYMPHS PCT: 34.4 % (ref 12.0–46.0)
Lymphs Abs: 1.2 10*3/uL (ref 0.7–4.0)
MCHC: 34.4 g/dL (ref 30.0–36.0)
MCV: 89.6 fl (ref 78.0–100.0)
MONO ABS: 0.4 10*3/uL (ref 0.1–1.0)
Monocytes Relative: 12.6 % — ABNORMAL HIGH (ref 3.0–12.0)
Neutro Abs: 1.7 10*3/uL (ref 1.4–7.7)
Neutrophils Relative %: 47.4 % (ref 43.0–77.0)
Platelets: 168 10*3/uL (ref 150.0–400.0)
RBC: 4.73 Mil/uL (ref 4.22–5.81)
RDW: 12.6 % (ref 11.5–15.5)
WBC: 3.5 10*3/uL — AB (ref 4.0–10.5)

## 2018-03-10 LAB — COMPREHENSIVE METABOLIC PANEL
ALT: 18 U/L (ref 0–53)
AST: 20 U/L (ref 0–37)
Albumin: 4.4 g/dL (ref 3.5–5.2)
Alkaline Phosphatase: 74 U/L (ref 39–117)
BUN: 15 mg/dL (ref 6–23)
CO2: 29 meq/L (ref 19–32)
Calcium: 9.7 mg/dL (ref 8.4–10.5)
Chloride: 100 mEq/L (ref 96–112)
Creatinine, Ser: 1.14 mg/dL (ref 0.40–1.50)
GFR: 85.59 mL/min (ref >60.00)
GLUCOSE: 151 mg/dL — AB (ref 70–99)
POTASSIUM: 3.9 meq/L (ref 3.5–5.1)
SODIUM: 137 meq/L (ref 135–145)
Total Bilirubin: 0.6 mg/dL (ref 0.2–1.2)
Total Protein: 7.5 g/dL (ref 6.0–8.3)

## 2018-03-10 NOTE — Progress Notes (Signed)
Subjective:    Patient ID: Blake Curtis, male    DOB: 06-08-1960, 57 y.o.   MRN: [id number]  Synopsis: Referred in July 2018 for new diagnosis of sarcoidosis.  HPI Chief Complaint  Patient presents with  . Follow-up    PFT today   Blake Curtis has been doing well since the last visit.  He says that his lymphadenopathy in his neck is improved and he no longer has discomfort there.  He says that he has not had to take anything for that right now as before when he was having to take NSAIDs fairly frequently.  He had to take the Plaquenil in the evenings because he said if he took it during the daytime it would make him jittery.  No problems with cough or shortness of breath   Past Medical History:  Diagnosis Date  . Diabetes mellitus without complication (HCC)    diet controlled  . Hypertension   . Sleep apnea      Family History  Problem Relation Age of Onset  . Diabetes Mother   . Colon cancer Neg Hx   . Pancreatic cancer Neg Hx   . Stomach cancer Neg Hx        Review of Systems  Constitutional: Negative for fever and unexpected weight change.  HENT: Negative for congestion, dental problem, ear pain, nosebleeds, postnasal drip, rhinorrhea, sinus pressure, sneezing, sore throat and trouble swallowing.   Eyes: Negative for redness and itching.  Respiratory: Negative for cough, chest tightness, shortness of breath and wheezing.   Cardiovascular: Negative for palpitations and leg swelling.  Gastrointestinal: Negative for nausea and vomiting.  Genitourinary: Negative for dysuria.  Musculoskeletal: Negative for joint swelling.  Skin: Negative for rash.  Neurological: Negative for headaches.  Hematological: Does not bruise/bleed easily.  Psychiatric/Behavioral: Negative for dysphoric mood. The patient is not nervous/anxious.        Objective:   Physical Exam Vitals:   03/10/18 1002  BP: 138/86  Pulse: 88  SpO2: 99%    Gen: well appearing HENT: OP clear, TM's clear, neck  supple> lymphadenopathy in submandibular area improved (and anterior cervical chain) PULM: CTA B, normal percussion CV: RRR, no mgr, trace edema GI: BS+, soft, nontender Derm: no cyanosis or rash Psyche: normal mood and affect    Pathology: 12/10/2016 neck lymph node needle aspiration non-caseating granulomas, special stains negative for organisms  PFT:  August 2018 ratio normal, FVC 4.89 L 86% predicted, total lung capacity 6.80 L 87% predicted, DLCO 24.0 63% October 2019 ratio normal, FVC 5.07 L 92% predicted, total lung capacity 6.82 L 89% predicted, DLCO 26.75 72 % predicted   Chest imaging: 2018 CT chest images independently reviewed showing diffuse mediastinal and hilar adenopathy, some small scattered pulmonary nodules but no pulmonary parenchymal abnormality 2019 CT chest images independently reviewed showing stable scattered small nodules in both lungs, adenopathy stable    Assessment & Plan:  Sarcoidosis  Lymphadenopathy Discussion: Blake Curtis has sarcoidosis which manifest itself predominantly as lymphadenopathy in his head neck and in the mediastinum.  Plaquenil has significantly decreased the size of the lymph nodes in his discomfort from it.  Fortunately, he does not have evidence of significant pulmonary parenchymal disease and his PFTs are stable.  Sarcoidosis: Continue taking Plaquenil 200 mg daily As always make sure you get an annual ophthalmology exam and make sure they know you have sarcoidosis and take Plaquenil We will plan on stopping Plaquenil in 3 months We will check blood work today to  make sure there is no evidence of toxicity from that medicine: Complete blood count and comprehensive metabolic panel If you reconsider a flu shot we are happy to provide it Practice good hand hygiene Stay active We will plan on repeating a lung function test in the fall 2020  Follow-up can see me in 3 months or sooner if needed   Current Outpatient Medications:  .   aspirin 81 MG tablet, Take 81 mg by mouth daily., Disp: , Rfl:  .  hydroxychloroquine (PLAQUENIL) 200 MG tablet, Take 200 mg by mouth daily., Disp: , Rfl:  .  metFORMIN (GLUCOPHAGE) 500 MG tablet, Take 500 mg by mouth daily with breakfast., Disp: , Rfl:  .  tamsulosin (FLOMAX) 0.4 MG CAPS capsule, Take 0.4 mg by mouth daily., Disp: , Rfl: 5

## 2018-03-10 NOTE — Patient Instructions (Signed)
Continue taking Plaquenil 200 mg daily As always make sure you get an annual ophthalmology exam and make sure they know you have sarcoidosis and take Plaquenil We will plan on stopping Plaquenil in 3 months We will check blood work today to make sure there is no evidence of toxicity from that medicine: Complete blood count and comprehensive metabolic panel If you reconsider a flu shot we are happy to provide it Practice good hand hygiene Stay active We will plan on repeating a lung function test in the fall 2020  Follow-up can see me in 3 months or sooner if needed

## 2018-03-10 NOTE — Addendum Note (Signed)
Addended by: Jannette Spanner on: 03/10/2018 10:36 AM   Modules accepted: Orders

## 2018-03-10 NOTE — Progress Notes (Signed)
PFT done today. 

## 2018-03-11 MED FILL — TAMSULOSIN HCL 0.4 MG CAP: 0.4 | 90 days supply | Qty: 90 | Fill #0

## 2018-03-11 MED FILL — HYDROXYCHLOROQUINE SULFATE: 200 | 30 days supply | Qty: 30 | Fill #1

## 2018-03-23 ENCOUNTER — Telehealth: Payer: Self-pay | Admitting: Pulmonary Disease

## 2018-03-23 NOTE — Telephone Encounter (Signed)
Called and spoke with pt letting him know the information stated per BQ. Pt expressed understanding. Nothing further needed.

## 2018-03-23 NOTE — Telephone Encounter (Signed)
Called and spoke with pt who states he is scheduled to receive shingles vaccine today, 10/29 at 4:30 but had concerns.  Pt wants to make sure there is no interaction between the plaquenil he is taking and wants to make sure he is okay to still get the vaccine.  Dr. Lake Bells, please advise on this for pt. Thanks!

## 2018-03-23 NOTE — Telephone Encounter (Signed)
He is not at increased risk of a complication, but as I understand it from reading the product label the vaccine may not be as effective while he is taking plaquenil.  I think it may be best for him to put it off for a few months and have the shot next year after we have him off plaquenil.

## 2018-03-29 DIAGNOSIS — E119 Type 2 diabetes mellitus without complications: Secondary | ICD-10-CM | POA: Diagnosis not present

## 2018-03-29 DIAGNOSIS — H5213 Myopia, bilateral: Secondary | ICD-10-CM | POA: Diagnosis not present

## 2018-03-29 DIAGNOSIS — H2513 Age-related nuclear cataract, bilateral: Secondary | ICD-10-CM | POA: Diagnosis not present

## 2018-03-29 DIAGNOSIS — H52223 Regular astigmatism, bilateral: Secondary | ICD-10-CM | POA: Diagnosis not present

## 2018-03-29 DIAGNOSIS — H524 Presbyopia: Secondary | ICD-10-CM | POA: Diagnosis not present

## 2018-03-29 DIAGNOSIS — H18413 Arcus senilis, bilateral: Secondary | ICD-10-CM | POA: Diagnosis not present

## 2018-03-29 DIAGNOSIS — Z7984 Long term (current) use of oral hypoglycemic drugs: Secondary | ICD-10-CM | POA: Diagnosis not present

## 2018-04-14 DIAGNOSIS — E119 Type 2 diabetes mellitus without complications: Secondary | ICD-10-CM | POA: Diagnosis not present

## 2018-04-14 DIAGNOSIS — R82998 Other abnormal findings in urine: Secondary | ICD-10-CM | POA: Diagnosis not present

## 2018-04-14 DIAGNOSIS — I10 Essential (primary) hypertension: Secondary | ICD-10-CM | POA: Diagnosis not present

## 2018-04-14 DIAGNOSIS — Z0000 Encounter for general adult medical examination without abnormal findings: Secondary | ICD-10-CM | POA: Diagnosis not present

## 2018-04-14 DIAGNOSIS — Z125 Encounter for screening for malignant neoplasm of prostate: Secondary | ICD-10-CM | POA: Diagnosis not present

## 2018-04-21 DIAGNOSIS — M545 Low back pain: Secondary | ICD-10-CM | POA: Diagnosis not present

## 2018-04-21 DIAGNOSIS — E1169 Type 2 diabetes mellitus with other specified complication: Secondary | ICD-10-CM | POA: Diagnosis not present

## 2018-04-21 DIAGNOSIS — Z0000 Encounter for general adult medical examination without abnormal findings: Secondary | ICD-10-CM | POA: Diagnosis not present

## 2018-04-21 DIAGNOSIS — K210 Gastro-esophageal reflux disease with esophagitis: Secondary | ICD-10-CM | POA: Diagnosis not present

## 2018-04-21 DIAGNOSIS — E669 Obesity, unspecified: Secondary | ICD-10-CM | POA: Diagnosis not present

## 2018-04-21 DIAGNOSIS — D869 Sarcoidosis, unspecified: Secondary | ICD-10-CM | POA: Diagnosis not present

## 2018-04-21 DIAGNOSIS — N401 Enlarged prostate with lower urinary tract symptoms: Secondary | ICD-10-CM | POA: Diagnosis not present

## 2018-04-21 DIAGNOSIS — I10 Essential (primary) hypertension: Secondary | ICD-10-CM | POA: Diagnosis not present

## 2018-04-21 DIAGNOSIS — G4730 Sleep apnea, unspecified: Secondary | ICD-10-CM | POA: Diagnosis not present

## 2018-06-09 MED FILL — HYDROXYCHLOROQUINE SULFATE: 200 | 30 days supply | Qty: 30 | Fill #2

## 2018-06-11 ENCOUNTER — Ambulatory Visit: Payer: 59 | Admitting: Pulmonary Disease

## 2018-07-13 MED FILL — TAMSULOSIN HCL 0.4 MG CAP: 0.4 | 90 days supply | Qty: 90 | Fill #0

## 2018-09-11 ENCOUNTER — Other Ambulatory Visit: Payer: Self-pay

## 2018-09-11 ENCOUNTER — Other Ambulatory Visit: Payer: Self-pay | Admitting: Pulmonary Disease

## 2018-09-11 ENCOUNTER — Encounter (HOSPITAL_COMMUNITY): Payer: Self-pay | Admitting: Family Medicine

## 2018-09-11 ENCOUNTER — Ambulatory Visit (HOSPITAL_COMMUNITY)
Admission: EM | Admit: 2018-09-11 | Discharge: 2018-09-11 | Disposition: A | Discharge status: Home / Self Care | Payer: 59 | Attending: Family Medicine | Admitting: Family Medicine

## 2018-09-11 DIAGNOSIS — R300 Dysuria: Secondary | ICD-10-CM | POA: Diagnosis not present

## 2018-09-11 LAB — POCT URINALYSIS DIP (DEVICE)
Bilirubin Urine: NEGATIVE
Glucose, UA: NEGATIVE mg/dL
Hgb urine dipstick: NEGATIVE
Ketones, ur: NEGATIVE mg/dL
Leukocytes,Ua: NEGATIVE
Nitrite: NEGATIVE
Protein, ur: NEGATIVE mg/dL
Specific Gravity, Urine: 1.025 (ref 1.005–1.030)
Urobilinogen, UA: 0.2 mg/dL (ref 0.0–1.0)
pH: 7 (ref 5.0–8.0)

## 2018-09-11 MED ORDER — DOXYCYCLINE HYCLATE 100 MG PO TABS: 100.0000 mg | ORAL_TABLET | Freq: Two times a day (BID) | ORAL | 0 refills | Status: DC

## 2018-09-11 MED FILL — DOXYCYCLINE HYCLATE 100 MG: 100 | 10 days supply | Qty: 20 | Fill #0

## 2018-09-11 NOTE — ED Provider Notes (Signed)
[place]    CSN: [id number] Arrival date & time: 09/11/18  1036     History   Chief Complaint Chief Complaint  Patient presents with  . Urinary Tract Infection    HPI FAROUK VIVERO is a 58 y.o. male.   Initial [facility] visit for this patient complaining of UTI symptoms.  He noticed some burning when he pees about one week ago and started drinking more water and cranberry juice. Took Azo as of yesterday. Still having the burning symptoms when urinating. Having urination urgency as well . NO FEVERS NO chills  Married, monogamous, no discharge.     Past Medical History:  Diagnosis Date  . Diabetes mellitus without complication (HCC)    diet controlled  . Hypertension   . Sleep apnea     Patient Active Problem List   Diagnosis Date Noted  . Prostate nodule 04/29/2013  . Benign neoplasm of colon 04/29/2013    Past Surgical History:  Procedure Laterality Date  . APPENDECTOMY  2002       Home Medications    Prior to Admission medications   Medication Sig Start Date End Date Taking? Authorizing Provider  aspirin 81 MG tablet Take 81 mg by mouth daily.    [provider]  doxycycline (VIBRA-TABS) 100 MG tablet Take 1 tablet (100 mg total) by mouth 2 (two) times daily. 09/11/18   Robyn Haber, MD  hydroxychloroquine (PLAQUENIL) 200 MG tablet Take 200 mg by mouth daily.    [provider]  metFORMIN (GLUCOPHAGE) 500 MG tablet Take 500 mg by mouth daily with breakfast.    [provider]  tamsulosin (FLOMAX) 0.4 MG CAPS capsule Take 0.4 mg by mouth daily. 12/03/17   [provider]    Family History Family History  Problem Relation Age of Onset  . Diabetes Mother   . Colon cancer Neg Hx   . Pancreatic cancer Neg Hx   . Stomach cancer Neg Hx     Social History Social History   Tobacco Use  . Smoking status: Never Smoker  . Smokeless tobacco: Never Used  Substance Use Topics  . Alcohol use: No  . Drug use:  No     Allergies   Losartan   Review of Systems Review of Systems  Genitourinary: Positive for dysuria.  All other systems reviewed and are negative.    Physical Exam Triage Vital Signs ED Triage Vitals  Enc Vitals Group     BP 09/11/18 1047 (!) 146/89     Pulse Rate 09/11/18 1047 74     Resp 09/11/18 1047 16     Temp 09/11/18 1047 97.7 F (36.5 C)     Temp Source 09/11/18 1047 Oral     SpO2 09/11/18 1047 98 %     Weight --      Height --      Head Circumference --      Peak Flow --      Pain Score 09/11/18 1046 0     Pain Loc --      Pain Edu? --      Excl. in [place]? --    No data found.  Updated Vital Signs BP (!) 146/89 (BP Location: Right Arm)   Pulse 74   Temp 97.7 F (36.5 C) (Oral)   Resp 16   SpO2 98%    Physical Exam Vitals signs and nursing note reviewed.  Constitutional:      Appearance: Normal appearance.  HENT:  Head: Normocephalic and atraumatic.     Mouth/Throat:     Mouth: Mucous membranes are moist.  Neck:     Musculoskeletal: Normal range of motion and neck supple.  Cardiovascular:     Rate and Rhythm: Normal rate.  Pulmonary:     Effort: Pulmonary effort is normal.  Musculoskeletal: Normal range of motion.  Skin:    General: Skin is warm and dry.  Neurological:     General: No focal deficit present.     Mental Status: He is alert.      UC Treatments / Results  Labs (all labs ordered are listed, but only abnormal results are displayed) Labs Reviewed  URINE CULTURE  POCT URINALYSIS DIP (DEVICE)    EKG None  Radiology No results found.  Procedures Procedures (including critical care time)  Medications Ordered in UC Medications - No data to display  Initial Impression / Assessment and Plan / UC Course  I have reviewed the triage vital signs and the nursing notes.  Pertinent labs & imaging results that were available during my care of the patient were reviewed by me and considered in my medical decision making  (see chart for details).    Final Clinical Impressions(s) / UC Diagnoses   Final diagnoses:  Dysuria     Discharge Instructions     You should see relief in 48 hours.    ED Prescriptions    Medication Sig Dispense Auth. Provider   doxycycline (VIBRA-TABS) 100 MG tablet Take 1 tablet (100 mg total) by mouth 2 (two) times daily. 20 tablet Robyn Haber, MD     Controlled Substance Prescriptions [place] Controlled Substance Registry consulted? Not Applicable   Robyn Haber, MD 09/11/18 1104

## 2018-09-11 NOTE — Discharge Instructions (Addendum)
You should see relief in 48 hours.

## 2018-09-11 NOTE — ED Triage Notes (Signed)
Per pt he noticed some burning when he pees about on e week ago and started drinking more water and cranberry juice. Took Azo as of yesterday. Still having the burning symptoms when urinating. Having urination urgency as well . NO FEVERS NO chills

## 2018-09-12 LAB — URINE CULTURE: Culture: NO GROWTH

## 2018-10-25 DIAGNOSIS — E1169 Type 2 diabetes mellitus with other specified complication: Secondary | ICD-10-CM | POA: Diagnosis not present

## 2018-10-25 DIAGNOSIS — I10 Essential (primary) hypertension: Secondary | ICD-10-CM | POA: Diagnosis not present

## 2018-10-25 DIAGNOSIS — K210 Gastro-esophageal reflux disease with esophagitis: Secondary | ICD-10-CM | POA: Diagnosis not present

## 2018-10-25 DIAGNOSIS — M545 Low back pain: Secondary | ICD-10-CM | POA: Diagnosis not present

## 2018-10-25 DIAGNOSIS — G4730 Sleep apnea, unspecified: Secondary | ICD-10-CM | POA: Diagnosis not present

## 2018-10-25 DIAGNOSIS — E669 Obesity, unspecified: Secondary | ICD-10-CM | POA: Diagnosis not present

## 2018-10-25 DIAGNOSIS — Z1331 Encounter for screening for depression: Secondary | ICD-10-CM | POA: Diagnosis not present

## 2018-10-25 DIAGNOSIS — D869 Sarcoidosis, unspecified: Secondary | ICD-10-CM | POA: Diagnosis not present

## 2018-10-25 DIAGNOSIS — N401 Enlarged prostate with lower urinary tract symptoms: Secondary | ICD-10-CM | POA: Diagnosis not present

## 2018-10-26 MED FILL — HYDROXYCHLOROQUINE SULFATE: 200 | 30 days supply | Qty: 30 | Fill #0

## 2019-03-22 DIAGNOSIS — K2100 Gastro-esophageal reflux disease with esophagitis, without bleeding: Secondary | ICD-10-CM | POA: Diagnosis not present

## 2019-03-22 DIAGNOSIS — N401 Enlarged prostate with lower urinary tract symptoms: Secondary | ICD-10-CM | POA: Diagnosis not present

## 2019-03-22 DIAGNOSIS — R1312 Dysphagia, oropharyngeal phase: Secondary | ICD-10-CM | POA: Diagnosis not present

## 2019-03-22 DIAGNOSIS — D869 Sarcoidosis, unspecified: Secondary | ICD-10-CM | POA: Diagnosis not present

## 2019-03-22 DIAGNOSIS — E1169 Type 2 diabetes mellitus with other specified complication: Secondary | ICD-10-CM | POA: Diagnosis not present

## 2019-03-22 MED FILL — HYDROXYCHLOROQUINE SULFATE: 200 | 30 days supply | Qty: 30 | Fill #0

## 2019-03-24 DIAGNOSIS — E119 Type 2 diabetes mellitus without complications: Secondary | ICD-10-CM | POA: Diagnosis not present

## 2019-03-30 ENCOUNTER — Encounter: Payer: Self-pay | Admitting: Pulmonary Disease

## 2019-03-30 ENCOUNTER — Other Ambulatory Visit: Payer: Self-pay

## 2019-03-30 ENCOUNTER — Ambulatory Visit (INDEPENDENT_AMBULATORY_CARE_PROVIDER_SITE_OTHER): Payer: 59 | Admitting: Pulmonary Disease

## 2019-03-30 DIAGNOSIS — D869 Sarcoidosis, unspecified: Secondary | ICD-10-CM | POA: Diagnosis not present

## 2019-03-30 DIAGNOSIS — Z23 Encounter for immunization: Secondary | ICD-10-CM

## 2019-03-30 DIAGNOSIS — R1310 Dysphagia, unspecified: Secondary | ICD-10-CM

## 2019-03-30 LAB — CBC WITH DIFFERENTIAL/PLATELET
Basophils Absolute: 0 10*3/uL (ref 0.0–0.1)
Basophils Relative: 0.8 % (ref 0.0–3.0)
Eosinophils Absolute: 0.2 10*3/uL (ref 0.0–0.7)
Eosinophils Relative: 4.8 % (ref 0.0–5.0)
HCT: 41.9 % (ref 39.0–52.0)
Hemoglobin: 14 g/dL (ref 13.0–17.0)
Lymphocytes Relative: 32.5 % (ref 12.0–46.0)
Lymphs Abs: 1.1 10*3/uL (ref 0.7–4.0)
MCHC: 33.3 g/dL (ref 30.0–36.0)
MCV: 91.8 fl (ref 78.0–100.0)
Monocytes Absolute: 0.4 10*3/uL (ref 0.1–1.0)
Monocytes Relative: 13.3 % — ABNORMAL HIGH (ref 3.0–12.0)
Neutro Abs: 1.6 10*3/uL (ref 1.4–7.7)
Neutrophils Relative %: 48.6 % (ref 43.0–77.0)
Platelets: 179 10*3/uL (ref 150.0–400.0)
RBC: 4.56 Mil/uL (ref 4.22–5.81)
RDW: 12.6 % (ref 11.5–15.5)
WBC: 3.3 10*3/uL — ABNORMAL LOW (ref 4.0–10.5)

## 2019-03-30 LAB — BASIC METABOLIC PANEL
BUN: 15 mg/dL (ref 6–23)
CO2: 32 mEq/L (ref 19–32)
Calcium: 9.8 mg/dL (ref 8.4–10.5)
Chloride: 102 mEq/L (ref 96–112)
Creatinine, Ser: 1.14 mg/dL (ref 0.40–1.50)
GFR: 80.22 mL/min (ref >60.00)
Glucose, Bld: 100 mg/dL — ABNORMAL HIGH (ref 70–99)
Potassium: 4.5 mEq/L (ref 3.5–5.1)
Sodium: 138 mEq/L (ref 135–145)

## 2019-03-30 NOTE — Patient Instructions (Signed)
CT neck and chest with IV contrast Based on this, we will decide need for swallowing study  Blood work today Flu shot

## 2019-03-30 NOTE — Progress Notes (Signed)
   Subjective:    Patient ID: Blake Curtis, male    DOB: [birth year], 58 y.o.   MRN: [id number]  HPI  58 year old never smoker for follow-up of sarcoidosis. He presented 11/2016 with lymphadenopathy in the neck and mediastinum.parenchymal involvement and lymph node aspiration from the neck showed noncaseating granulomas He was placed on Plaquenil for a few months and then stopped  Chief Complaint  Patient presents with  . Sarcoidosis    Breathing ok. But has been having trouble swallowing food since last week Thursday. Will have to drink something to "push" the food down. Also has to turn neck to help fooed move.    Last office visit 02/2018, BQ patient He was treated with Plaquenil in 2019 for a few months and neck lymph nodes went down and he stopped taking this.  For the past 2 weeks, he reports dysphagia especially to solids and he has to turn his neck to the right to make food pass or chase it down with liquids.  He associates this after a dental procedure and wonders if numbing medication may have had something to do with this. He denies fevers, muscle aches or skin rash. Breathing is stable  I reviewed his prior imaging including CT neck and chest    Significant tests/ events reviewed  12/10/2016 neck lymph node needle aspiration non-caseating granulomas, special stains negative for organisms  PFT:  August 2018 ratio normal, FVC 4.89 L 86% predicted, total lung capacity 6.80 L 87% predicted, DLCO 24.0 63% October 2019 ratio normal, FVC 5.07 L 92% predicted, total lung capacity 6.82 L 89% predicted, DLCO 26.75 72 % predicted   Chest imaging: 2018 CT chest images independently reviewed showing diffuse mediastinal and hilar adenopathy, some small scattered pulmonary nodules but no pulmonary parenchymal abnormality 2019 CT chest images independently reviewed showing stable scattered small nodules in both lungs, adenopathy stable    Past Medical History:  Diagnosis Date  .  Diabetes mellitus without complication (HCC)    diet controlled  . Hypertension   . Sleep apnea     Review of Systems neg for any significant sore throat, dysphagia, itching, sneezing, nasal congestion or excess/ purulent secretions, fever, chills, sweats, unintended wt loss, pleuritic or exertional cp, hempoptysis, orthopnea pnd or change in chronic leg swelling. Also denies presyncope, palpitations, heartburn, abdominal pain, nausea, vomiting, diarrhea or change in bowel or urinary habits, dysuria,hematuria, rash, arthralgias, visual complaints, headache, numbness weakness or ataxia.     Objective:   Physical Exam   Gen. Pleasant, obese, in no distress, normal affect ENT - no pallor,icterus, no post nasal drip, class 2 airway Neck: No JVD, no thyromegaly, no carotid bruits, palpable bilateral lymphadenopathy nontender, left posterior chain 2 to 3 cm Lungs: no use of accessory muscles, no dullness to percussion, decreased without rales or rhonchi  Cardiovascular: Rhythm regular, heart sounds  normal, no murmurs or gallops, no peripheral edema Abdomen: soft and non-tender, no hepatosplenomegaly, BS normal. Musculoskeletal: No deformities, no cyanosis or clubbing Neuro:  alert, non focal, no tremors        Assessment & Plan:

## 2019-03-30 NOTE — Assessment & Plan Note (Signed)
Appears to be of recent onset, neck lymphadenopathy does not seem to be large enough to cause obstruction. We will review CT neck and if not significant lymphadenopathy then we will proceed with esophagram with speech therapist present

## 2019-03-30 NOTE — Assessment & Plan Note (Addendum)
He does have palpable lymphadenopathy, likely sarcoidosis is flaring up again  We will proceed with CT of neck and chest with IV contrast to review Also check ACE level & bmet  Asked him to get the flu shot today

## 2019-04-01 LAB — ANGIOTENSIN CONVERTING ENZYME: Angiotensin-Converting Enzyme: 33 U/L (ref 9–67)

## 2019-04-11 DIAGNOSIS — D869 Sarcoidosis, unspecified: Secondary | ICD-10-CM

## 2019-04-11 NOTE — Telephone Encounter (Signed)
Patient sent email this morning 04/11/19  "I am feeling better just wanted to know do I still need to keep my CT scan appointment? My swallowing is back to normal after using Ofloxacin in my ear."  Dr. Elsworth Soho please advise

## 2019-04-12 NOTE — Telephone Encounter (Signed)
Can cancel and obtain chest x-ray instead to follow-up on sarcoidosis

## 2019-04-14 ENCOUNTER — Ambulatory Visit (INDEPENDENT_AMBULATORY_CARE_PROVIDER_SITE_OTHER)
Admission: RE | Admit: 2019-04-14 | Discharge: 2019-04-14 | Disposition: A | Discharge status: Home / Self Care | Payer: 59 | Source: Ambulatory Visit | Attending: Pulmonary Disease | Admitting: Pulmonary Disease

## 2019-04-14 ENCOUNTER — Other Ambulatory Visit: Payer: Self-pay

## 2019-04-14 DIAGNOSIS — R1310 Dysphagia, unspecified: Secondary | ICD-10-CM

## 2019-04-14 DIAGNOSIS — R918 Other nonspecific abnormal finding of lung field: Secondary | ICD-10-CM | POA: Diagnosis not present

## 2019-04-14 DIAGNOSIS — D869 Sarcoidosis, unspecified: Secondary | ICD-10-CM

## 2019-04-14 MED ORDER — IOHEXOL 300 MG/ML  SOLN
80.0000 mL | Freq: Once | INTRAMUSCULAR | Status: AC | PRN
  Administered 2019-04-14: 80 mL via INTRAVENOUS

## 2019-04-20 NOTE — Progress Notes (Signed)
LMTCB

## 2019-04-28 ENCOUNTER — Ambulatory Visit: Payer: 59 | Admitting: Pulmonary Disease

## 2019-04-29 DIAGNOSIS — Z125 Encounter for screening for malignant neoplasm of prostate: Secondary | ICD-10-CM | POA: Diagnosis not present

## 2019-04-29 DIAGNOSIS — E1169 Type 2 diabetes mellitus with other specified complication: Secondary | ICD-10-CM | POA: Diagnosis not present

## 2019-04-29 DIAGNOSIS — Z0000 Encounter for general adult medical examination without abnormal findings: Secondary | ICD-10-CM | POA: Diagnosis not present

## 2019-05-02 DIAGNOSIS — G4730 Sleep apnea, unspecified: Secondary | ICD-10-CM | POA: Diagnosis not present

## 2019-05-02 DIAGNOSIS — D869 Sarcoidosis, unspecified: Secondary | ICD-10-CM | POA: Diagnosis not present

## 2019-05-02 DIAGNOSIS — E1169 Type 2 diabetes mellitus with other specified complication: Secondary | ICD-10-CM | POA: Diagnosis not present

## 2019-05-02 DIAGNOSIS — Z20828 Contact with and (suspected) exposure to other viral communicable diseases: Secondary | ICD-10-CM | POA: Diagnosis not present

## 2019-05-02 DIAGNOSIS — Z79899 Other long term (current) drug therapy: Secondary | ICD-10-CM | POA: Diagnosis not present

## 2019-05-02 DIAGNOSIS — N401 Enlarged prostate with lower urinary tract symptoms: Secondary | ICD-10-CM | POA: Diagnosis not present

## 2019-05-02 DIAGNOSIS — Z1339 Encounter for screening examination for other mental health and behavioral disorders: Secondary | ICD-10-CM | POA: Diagnosis not present

## 2019-05-02 DIAGNOSIS — I10 Essential (primary) hypertension: Secondary | ICD-10-CM | POA: Diagnosis not present

## 2019-05-02 DIAGNOSIS — Z0000 Encounter for general adult medical examination without abnormal findings: Secondary | ICD-10-CM | POA: Diagnosis not present

## 2019-05-05 MED FILL — FINASTERIDE 5 MG TABLET: 5 | 90 days supply | Qty: 90 | Fill #0

## 2019-05-05 MED FILL — TAMSULOSIN HCL 0.4 MG CAP: 0.4 | 90 days supply | Qty: 90 | Fill #1

## 2019-06-16 MED FILL — HYDROXYCHLOROQUINE SULFATE: 200 | 30 days supply | Qty: 30 | Fill #0

## 2019-08-04 DIAGNOSIS — K2100 Gastro-esophageal reflux disease with esophagitis, without bleeding: Secondary | ICD-10-CM | POA: Diagnosis not present

## 2019-08-04 DIAGNOSIS — R972 Elevated prostate specific antigen [PSA]: Secondary | ICD-10-CM | POA: Diagnosis not present

## 2019-08-04 DIAGNOSIS — Z1331 Encounter for screening for depression: Secondary | ICD-10-CM | POA: Diagnosis not present

## 2019-08-04 DIAGNOSIS — E1169 Type 2 diabetes mellitus with other specified complication: Secondary | ICD-10-CM | POA: Diagnosis not present

## 2019-08-04 DIAGNOSIS — N401 Enlarged prostate with lower urinary tract symptoms: Secondary | ICD-10-CM | POA: Diagnosis not present

## 2019-08-04 DIAGNOSIS — D869 Sarcoidosis, unspecified: Secondary | ICD-10-CM | POA: Diagnosis not present

## 2019-08-11 ENCOUNTER — Ambulatory Visit: Payer: 59 | Attending: Internal Medicine

## 2019-08-11 DIAGNOSIS — Z23 Encounter for immunization: Secondary | ICD-10-CM

## 2019-08-11 NOTE — Progress Notes (Signed)
   Covid-19 Vaccination Clinic  Name:  Blake Curtis    MRN: [id number] DOB: 1960-11-09  08/11/2019  [name] was observed post Covid-19 immunization for 15 minutes without incident. He was provided with Vaccine Information Sheet and instruction to access the V-Safe system.   [name] was instructed to call 911 with any severe reactions post vaccine: Marland Kitchen Difficulty breathing  . Swelling of face and throat  . A fast heartbeat  . A bad rash all over body  . Dizziness and weakness   Immunizations Administered    Name Date Dose VIS Date Route   Pfizer COVID-19 Vaccine 08/11/2019  8:13 AM 0.3 mL 05/06/2019 Intramuscular   Manufacturer: [place]   Lot: [id number]   [place]: [id number]

## 2019-08-18 DIAGNOSIS — I10 Essential (primary) hypertension: Secondary | ICD-10-CM | POA: Diagnosis not present

## 2019-08-18 DIAGNOSIS — R82998 Other abnormal findings in urine: Secondary | ICD-10-CM | POA: Diagnosis not present

## 2019-08-29 MED FILL — metFORMIN HCL 500 MG TABS: 500 | 90 days supply | Qty: 180 | Fill #0

## 2019-09-06 ENCOUNTER — Ambulatory Visit: Payer: 59 | Attending: Internal Medicine

## 2019-09-06 DIAGNOSIS — Z23 Encounter for immunization: Secondary | ICD-10-CM

## 2019-09-06 NOTE — Progress Notes (Signed)
   Covid-19 Vaccination Clinic  Name:  Blake Curtis    MRN: [id number] DOB: Aug 20, 1960  09/06/2019  Mr. Blake Curtis was observed post Covid-19 immunization for 15 minutes without incident. He was provided with Vaccine Information Sheet and instruction to access the V-Safe system.   Mr. Blake Curtis was instructed to call 911 with any severe reactions post vaccine: Marland Kitchen Difficulty breathing  . Swelling of face and throat  . A fast heartbeat  . A bad rash all over body  . Dizziness and weakness   Immunizations Administered    Name Date Dose VIS Date Route   Pfizer COVID-19 Vaccine 09/06/2019  8:27 AM 0.3 mL 05/06/2019 Intramuscular   Manufacturer: [place]   Lot: [id number]   [place]: [id number]

## 2019-10-15 DIAGNOSIS — R972 Elevated prostate specific antigen [PSA]: Secondary | ICD-10-CM | POA: Insufficient documentation

## 2019-10-15 DIAGNOSIS — N401 Enlarged prostate with lower urinary tract symptoms: Secondary | ICD-10-CM | POA: Insufficient documentation

## 2019-10-17 DIAGNOSIS — N138 Other obstructive and reflux uropathy: Secondary | ICD-10-CM | POA: Diagnosis not present

## 2019-10-17 DIAGNOSIS — N402 Nodular prostate without lower urinary tract symptoms: Secondary | ICD-10-CM | POA: Diagnosis not present

## 2019-10-17 DIAGNOSIS — N401 Enlarged prostate with lower urinary tract symptoms: Secondary | ICD-10-CM | POA: Diagnosis not present

## 2019-10-17 DIAGNOSIS — R972 Elevated prostate specific antigen [PSA]: Secondary | ICD-10-CM | POA: Diagnosis not present

## 2019-10-18 ENCOUNTER — Other Ambulatory Visit: Payer: Self-pay | Admitting: Urology

## 2019-10-18 ENCOUNTER — Other Ambulatory Visit (HOSPITAL_COMMUNITY): Payer: Self-pay | Admitting: Urology

## 2019-10-18 DIAGNOSIS — N402 Nodular prostate without lower urinary tract symptoms: Secondary | ICD-10-CM

## 2019-10-18 DIAGNOSIS — R972 Elevated prostate specific antigen [PSA]: Secondary | ICD-10-CM

## 2019-11-08 ENCOUNTER — Ambulatory Visit (HOSPITAL_COMMUNITY): Payer: 59

## 2019-11-11 ENCOUNTER — Ambulatory Visit (HOSPITAL_COMMUNITY)
Admission: RE | Admit: 2019-11-11 | Discharge: 2019-11-11 | Disposition: A | Discharge status: Home / Self Care | Payer: 59 | Source: Ambulatory Visit | Attending: Urology | Admitting: Urology

## 2019-11-11 ENCOUNTER — Other Ambulatory Visit: Payer: Self-pay

## 2019-11-11 DIAGNOSIS — N402 Nodular prostate without lower urinary tract symptoms: Secondary | ICD-10-CM | POA: Insufficient documentation

## 2019-11-11 DIAGNOSIS — R590 Localized enlarged lymph nodes: Secondary | ICD-10-CM | POA: Diagnosis not present

## 2019-11-11 DIAGNOSIS — R972 Elevated prostate specific antigen [PSA]: Secondary | ICD-10-CM | POA: Diagnosis not present

## 2019-11-11 MED ORDER — GADOBUTROL 1 MMOL/ML IV SOLN
10.0000 mL | Freq: Once | INTRAVENOUS | Status: AC | PRN
  Administered 2019-11-11: 10 mL via INTRAVENOUS

## 2019-11-17 MED FILL — CIPROFLOXACIN HCL 500 MG TA: 500 | 3 days supply | Qty: 6 | Fill #0

## 2019-12-27 DIAGNOSIS — N401 Enlarged prostate with lower urinary tract symptoms: Secondary | ICD-10-CM | POA: Diagnosis not present

## 2019-12-27 DIAGNOSIS — R972 Elevated prostate specific antigen [PSA]: Secondary | ICD-10-CM | POA: Diagnosis not present

## 2019-12-27 DIAGNOSIS — R3912 Poor urinary stream: Secondary | ICD-10-CM | POA: Diagnosis not present

## 2019-12-27 DIAGNOSIS — N403 Nodular prostate with lower urinary tract symptoms: Secondary | ICD-10-CM | POA: Diagnosis not present

## 2019-12-27 MED FILL — levoFLOXacin 750 MG TABS: 750 | 1 days supply | Qty: 1 | Fill #0

## 2020-01-12 DIAGNOSIS — N414 Granulomatous prostatitis: Secondary | ICD-10-CM | POA: Diagnosis not present

## 2020-01-12 DIAGNOSIS — N411 Chronic prostatitis: Secondary | ICD-10-CM | POA: Diagnosis not present

## 2020-01-12 DIAGNOSIS — R972 Elevated prostate specific antigen [PSA]: Secondary | ICD-10-CM | POA: Diagnosis not present

## 2020-01-19 MED FILL — METFORMIN HCL 500 MG TABS: 500 | 90 days supply | Qty: 180 | Fill #1

## 2020-01-31 MED FILL — METFORMIN HCL 500 MG TABS: 500 | 90 days supply | Qty: 180 | Fill #1

## 2020-02-14 DIAGNOSIS — N403 Nodular prostate with lower urinary tract symptoms: Secondary | ICD-10-CM | POA: Diagnosis not present

## 2020-02-14 DIAGNOSIS — R3912 Poor urinary stream: Secondary | ICD-10-CM | POA: Diagnosis not present

## 2020-02-14 DIAGNOSIS — N401 Enlarged prostate with lower urinary tract symptoms: Secondary | ICD-10-CM | POA: Diagnosis not present

## 2020-02-17 ENCOUNTER — Other Ambulatory Visit: Payer: 59

## 2020-02-18 DIAGNOSIS — Z20822 Contact with and (suspected) exposure to covid-19: Secondary | ICD-10-CM | POA: Diagnosis not present

## 2020-02-21 ENCOUNTER — Other Ambulatory Visit: Payer: Self-pay | Admitting: Urology

## 2020-03-13 NOTE — Patient Instructions (Addendum)
DUE TO COVID-19 ONLY ONE VISITOR IS ALLOWED TO COME WITH YOU AND STAY IN THE WAITING ROOM ONLY DURING PRE OP AND PROCEDURE DAY OF SURGERY. THE 1 VISITOR  MAY VISIT WITH YOU AFTER SURGERY IN YOUR PRIVATE ROOM DURING VISITING HOURS ONLY!  YOU NEED TO HAVE A COVID 19 TEST ON: 03/15/20 @ 2:45 PM ,THIS TEST MUST BE DONE BEFORE SURGERY,  COVID TESTING SITE [place] [id number], IT IS ON THE RIGHT GOING OUT [address] APPROXIMATELY  2 MINUTES PAST [employer] ON THE RIGHT. ONCE YOUR COVID TEST IS COMPLETED,  PLEASE BEGIN THE QUARANTINE INSTRUCTIONS AS OUTLINED IN YOUR HANDOUT.                Blake Curtis    Your procedure is scheduled on: 03/19/20   Report to [facility] Main  Entrance   Report to admitting at: 7:00 AM     Call this number if you have problems the morning of surgery [phone]    Remember: Do not eat food or drink liquids :After Midnight.   BRUSH YOUR TEETH MORNING OF SURGERY AND RINSE YOUR MOUTH OUT, NO CHEWING GUM CANDY OR MINTS.  How to Manage Your Diabetes Before and After Surgery  Why is it important to control my blood sugar before and after surgery? . Improving blood sugar levels before and after surgery helps healing and can limit problems. . A way of improving blood sugar control is eating a healthy diet by: o  Eating less sugar and carbohydrates o  Increasing activity/exercise o  Talking with your doctor about reaching your blood sugar goals . High blood sugars (greater than 180 mg/dL) can raise your risk of infections and slow your recovery, so you will need to focus on controlling your diabetes during the weeks before surgery. . Make sure that the doctor who takes care of your diabetes knows about your planned surgery including the date and location.  How do I manage my blood sugar before surgery? . Check your blood sugar at least 4 times a day, starting 2 days before surgery, to make sure that the level is not too  high or low. o Check your blood sugar the morning of your surgery when you wake up and every 2 hours until you get to the Short Stay unit. . If your blood sugar is less than 70 mg/dL, you will need to treat for low blood sugar: o Do not take insulin. o Treat a low blood sugar (less than 70 mg/dL) with  cup of clear juice (cranberry or apple), 4 glucose tablets, OR glucose gel. o Recheck blood sugar in 15 minutes after treatment (to make sure it is greater than 70 mg/dL). If your blood sugar is not greater than 70 mg/dL on recheck, call [phone] for further instructions. . Report your blood sugar to the short stay nurse when you get to Short Stay.  . If you are admitted to the hospital after surgery: o Your blood sugar will be checked by the staff and you will probably be given insulin after surgery (instead of oral diabetes medicines) to make sure you have good blood sugar levels. o The goal for blood sugar control after surgery is 80-180 mg/dL.   WHAT DO I DO ABOUT MY DIABETES MEDICATION?  Marland Kitchen Do not take oral diabetes medicines (pills) the morning of surgery.  . THE DAY BEFORE SURGERY, take Metformin as usual.      . THE MORNING  OF SURGERY:  DO NOT TAKE ANY DIABETIC MEDICATIONS DAY OF YOUR SURGERY                               You may not have any metal on your body including hair pins and              piercings  Do not wear jewelry, lotions, powders or perfumes, deodorant             Men may shave face and neck.   Do not bring valuables to the hospital. [place].  Contacts, dentures or bridgework may not be worn into surgery.  Leave suitcase in the car. After surgery it may be brought to your room.     Patients discharged the day of surgery will not be allowed to drive home. IF YOU ARE HAVING SURGERY AND GOING HOME THE SAME DAY, YOU MUST HAVE AN ADULT TO DRIVE YOU HOME AND BE WITH YOU FOR 24 HOURS. YOU MAY GO HOME BY TAXI OR  UBER OR ORTHERWISE, BUT AN ADULT MUST ACCOMPANY YOU HOME AND STAY WITH YOU FOR 24 HOURS.  Name and phone number of your driver:  Special Instructions: N/A              Please read over the following fact sheets you were given: _____________________________________________________________________         [facility] - Preparing for Surgery Before surgery, you can play an important role.  Because skin is not sterile, your skin needs to be as free of germs as possible.  You can reduce the number of germs on your skin by washing with CHG (chlorahexidine gluconate) soap before surgery.  CHG is an antiseptic cleaner which kills germs and bonds with the skin to continue killing germs even after washing. Please DO NOT use if you have an allergy to CHG or antibacterial soaps.  If your skin becomes reddened/irritated stop using the CHG and inform your nurse when you arrive at Short Stay. Do not shave (including legs and underarms) for at least 48 hours prior to the first CHG shower.  You may shave your face/neck. Please follow these instructions carefully:  1.  Shower with CHG Soap the night before surgery and the  morning of Surgery.  2.  If you choose to wash your hair, wash your hair first as usual with your  normal  shampoo.  3.  After you shampoo, rinse your hair and body thoroughly to remove the  shampoo.                           4.  Use CHG as you would any other liquid soap.  You can apply chg directly  to the skin and wash                       Gently with a scrungie or clean washcloth.  5.  Apply the CHG Soap to your body ONLY FROM THE NECK DOWN.   Do not use on face/ open                           Wound or open sores. Avoid contact with eyes, ears mouth and genitals (private parts).  Wash face,  Genitals (private parts) with your normal soap.             6.  Wash thoroughly, paying special attention to the area where your surgery  will be performed.  7.  Thoroughly rinse  your body with warm water from the neck down.  8.  DO NOT shower/wash with your normal soap after using and rinsing off  the CHG Soap.                9.  Pat yourself dry with a clean towel.            10.  Wear clean pajamas.            11.  Place clean sheets on your bed the night of your first shower and do not  sleep with pets. Day of Surgery : Do not apply any lotions/deodorants the morning of surgery.  Please wear clean clothes to the hospital/surgery center.  FAILURE TO FOLLOW THESE INSTRUCTIONS MAY RESULT IN THE CANCELLATION OF YOUR SURGERY PATIENT SIGNATURE_________________________________  NURSE SIGNATURE__________________________________  ________________________________________________________________________

## 2020-03-14 ENCOUNTER — Other Ambulatory Visit: Payer: Self-pay

## 2020-03-14 ENCOUNTER — Encounter (HOSPITAL_COMMUNITY): Payer: Self-pay

## 2020-03-14 ENCOUNTER — Encounter (HOSPITAL_COMMUNITY)
Admission: RE | Admit: 2020-03-14 | Discharge: 2020-03-14 | Disposition: A | Discharge status: Home / Self Care | Payer: 59 | Source: Ambulatory Visit | Attending: Urology | Admitting: Urology

## 2020-03-14 DIAGNOSIS — Z01818 Encounter for other preprocedural examination: Secondary | ICD-10-CM | POA: Insufficient documentation

## 2020-03-14 LAB — GLUCOSE, CAPILLARY: Glucose-Capillary: 88 mg/dL (ref 70–99)

## 2020-03-14 LAB — CBC
HCT: 40.4 % (ref 39.0–52.0)
Hemoglobin: 13.5 g/dL (ref 13.0–17.0)
MCH: 31.2 pg (ref 26.0–34.0)
MCHC: 33.4 g/dL (ref 30.0–36.0)
MCV: 93.3 fL (ref 80.0–100.0)
Platelets: 163 10*3/uL (ref 150–400)
RBC: 4.33 MIL/uL (ref 4.22–5.81)
RDW: 12.2 % (ref 11.5–15.5)
WBC: 3 10*3/uL — ABNORMAL LOW (ref 4.0–10.5)
nRBC: 0 % (ref 0.0–0.2)

## 2020-03-14 LAB — BASIC METABOLIC PANEL
Anion gap: 9 (ref 5–15)
BUN: 14 mg/dL (ref 6–20)
CO2: 27 mmol/L (ref 22–32)
Calcium: 9.2 mg/dL (ref 8.9–10.3)
Chloride: 104 mmol/L (ref 98–111)
Creatinine, Ser: 1.08 mg/dL (ref 0.61–1.24)
GFR, Estimated: >60 mL/min (ref >60)
Glucose, Bld: 100 mg/dL — ABNORMAL HIGH (ref 70–99)
Potassium: 4.3 mmol/L (ref 3.5–5.1)
Sodium: 140 mmol/L (ref 135–145)

## 2020-03-14 NOTE — Progress Notes (Addendum)
COVID Vaccine Completed: Yes Date COVID Vaccine completed: 09/06/19 COVID vaccine manufacturer: Pfizer    PCP - Dr. Burnard Bunting. Cardiologist -   Chest x-ray -  EKG -  Stress Test -  ECHO - 04/23/17. EPIC Cardiac Cath -  Pacemaker/ICD device last checked:  Sleep Study - Yes CPAP - No  Fasting Blood Sugar - 100's Checks Blood Sugar ___3__ times a week  Blood Thinner Instructions: Aspirin Instructions: Last Dose:  Anesthesia review: Hx: DIA,HTN,OSA  Patient denies shortness of breath, fever, cough and chest pain at PAT appointment   Patient verbalized understanding of instructions that were given to them at the PAT appointment. Patient was also instructed that they will need to review over the PAT instructions again at home before surgery.

## 2020-03-14 NOTE — Progress Notes (Signed)
Pt. Does not need a COVID test done.He tested positive for COVID on 02/18/20.Copy in the chart.

## 2020-03-15 ENCOUNTER — Other Ambulatory Visit (HOSPITAL_COMMUNITY): Payer: 59

## 2020-03-15 LAB — HEMOGLOBIN A1C
Hgb A1c MFr Bld: 6.1 % — ABNORMAL HIGH (ref 4.8–5.6)
Mean Plasma Glucose: 128 mg/dL

## 2020-03-16 NOTE — H&P (Signed)
1. Elevated PSA/prostate nodule  2. BPH/LUTS   Mr. Blake Curtis is a 59 year old gentleman who returns today after his prostate biopsy on 01/12/2020. This was performed for an elevated PSA and prostate nodule. This fortunately was benign but did show changes of chronic inflammation as previously noted. He continues to have significant lower urinary tract symptoms. IPSS is 21. He continues to have significant bother from the symptoms. His most prevalent symptoms are sense of incomplete emptying, weak stream, straining, and intermittency. He follows up today with a PVR. His prostate volume was measured at 80.3 cc. He was noted to have a large protruding intravesical median lobe on ultrasound.     ALLERGIES: No Allergies    MEDICATIONS: Levofloxacin 750 mg tablet Please take one tablet the morning of your biopsy.  Aspirin Ec 81 mg tablet, delayed release Oral  Metformin Hcl 500 mg tablet Oral  Multivitamin     GU PSH: Biopsy Prostate Prostate Needle Biopsy - 01/12/2020       PSH Notes: Appendectomy   NON-GU PSH: Appendectomy - 2015 Surgical Pathology, Gross And Microscopic Examination For Prostate Needle - 01/12/2020     GU PMH: Prostate nodule w/ LUTS - 12/27/2019 Weak Urinary Stream - 12/27/2019 BPH w/LUTS, Enlarged prostate with lower urinary tract symptoms (LUTS) - 2016 Elevated PSA, Elevated prostate specific antigen (PSA) - 2016 Male ED, unspecified, Organic impotence - 2016 Urinary Frequency, Increased urinary frequency - 2016      PMH Notes:   1) Elevated PSA: His baseline PSA in 2014 was 2.52. His PSA increased to 15.37 in 2016 prompting a prostate biopsy by Dr. Tresa Moore in January 2016 that was benign with granulomatous inflammation. He was then lost to urologic follow up. He was seen by Dr. Lawerance Bach at [facility] in May 2021 for a PSA of 7.73. An MRI of the prostate demonstrated a PI-RADS 3 lesion but within the intravesical median lobe of his large prostate. He presented to me  in August 2021 for another opinion. He did have a left medial apex nodule suspicious for malignancy.   Jan 2016: 12 core biopsy for PSA of 15.37 - Benign with granulomatous inflammation, Vol 104 cc  Jun 2021: MRI (PSA 7.63) - PI-RADS 3 lesion of intravesical median lobe, Vol 107 cc  Aug 2021: L apical nodule  Aug 2021: 14 core biopsy - Benign with chronic and granulomatous inflammation, Vol 80.3 cc, large intravesical median lobe   2) BPH/LUTS: He has long standing history of LUTS. Baseline IPSS was 28.   Prior treatment: Finasteride (ineffective), Tamsulosin (ineffective and orthostasis)   3) Erectile dysfunction:   NON-GU PMH: Diabetes Type 2    FAMILY HISTORY: Death of family member - Runs In Family   SOCIAL HISTORY: Marital Status: Married Preferred Language: English; Ethnicity: Not Hispanic Or Latino; Race: Black or African American Current Smoking Status: Patient has never smoked.   Tobacco Use Assessment Completed: Used Tobacco in last 30 days? Has never drank.  Does not drink caffeine.     Notes: Alcohol use, Occupation, Caffeine use, Married, Never smoked tobacco   REVIEW OF SYSTEMS:    GU Review Male:   Patient denies trouble starting your streams, have to strain to urinate , frequent urination, get up at night to urinate, stream starts and stops, leakage of urine, hard to postpone urination, and burning/ pain with urination.  Gastrointestinal (Upper):   Patient denies nausea and vomiting.  Gastrointestinal (Lower):   Patient denies diarrhea and  constipation.  Constitutional:   Patient denies fever, night sweats, weight loss, and fatigue.  Skin:   Patient denies skin rash/ lesion and itching.  Eyes:   Patient denies blurred vision and double vision.  Ears/ Nose/ Throat:   Patient denies sore throat and sinus problems.  Hematologic/Lymphatic:   Patient denies swollen glands and easy bruising.  Cardiovascular:   Patient denies leg swelling and chest pains.  Respiratory:    Patient denies cough and shortness of breath.  Endocrine:   Patient denies excessive thirst.  Musculoskeletal:   Patient denies back pain and joint pain.  Neurological:   Patient denies headaches and dizziness.  Psychologic:   Patient denies depression and anxiety.   VITAL SIGNS:     Weight 263 lb / 119.29 kg  Height 73 in / 185.42 cm  BMI 34.7 kg/m   MULTI-SYSTEM PHYSICAL EXAMINATION:    Constitutional: Well-nourished. No physical deformities. Normally developed. Good grooming.  Respiratory: No labored breathing, no use of accessory muscles. Clear bilaterally.  Cardiovascular: Normal temperature, normal extremity pulses, no swelling, no varicosities. Regular rate and rhythm.     Complexity of Data:  Lab Test Review:   PSA  Records Review:   Pathology Reports, Previous Patient Records  Urodynamics Review:   Review Bladder Scan   05/17/14  PSA  Total PSA 15.37     PROCEDURES:         PVR Ultrasound - 68115  Scanned Volume: 273 cc         Urinalysis - 81003 Dipstick Dipstick Cont'd  Specimen: Voided Bilirubin: Neg  Color: Yellow Ketones: Neg  Appearance: Clear Blood: Neg  Specific Gravity: 1.025 Protein: Trace  pH: 6.0 Urobilinogen: 0.2  Glucose: Neg Nitrites: Neg    Leukocyte Esterase: Neg    Notes:      ASSESSMENT:      ICD-10 Details  1 GU:   BPH w/LUTS - N40.1   2   Weak Urinary Stream - R39.12   3   Prostate nodule w/ LUTS - N40.3    PLAN:        1. Elevated PSA/prostate nodule: We reviewed his biopsy results. Fortunately, there was no evidence of malignancy. He will continue routine screening and we will plan to recheck a PSA/DRE within the next 6 months. This will also help to establish his baseline status post his upcoming procedure.   2. BPH/LUTS: We discussed his findings with regard to his prostate volume and configuration at the time of his recent ultrasound. He does have a large intravesical median lobe that appears to be the source of his  symptoms. This is likely why he did not respond well to medical therapy. After reviewing options for treatment, he is most interested in proceeding with a transurethral resection of the prostate. We have extensively reviewed the potential risks, complications, and expected recovery process associated with this procedure. He gives informed consent to proceed and would like to schedule as soon as reasonably possible.

## 2020-03-19 ENCOUNTER — Encounter (HOSPITAL_COMMUNITY): Payer: Self-pay | Admitting: Urology

## 2020-03-19 ENCOUNTER — Other Ambulatory Visit: Payer: Self-pay

## 2020-03-19 ENCOUNTER — Ambulatory Visit (HOSPITAL_COMMUNITY): Payer: 59 | Admitting: Certified Registered Nurse Anesthetist

## 2020-03-19 ENCOUNTER — Encounter (HOSPITAL_COMMUNITY)
Admission: RE | Disposition: A | Discharge status: Home / Self Care | Payer: Self-pay | Source: Ambulatory Visit | Attending: Urology

## 2020-03-19 ENCOUNTER — Observation Stay (HOSPITAL_COMMUNITY)
Admission: RE | Admit: 2020-03-19 | Discharge: 2020-03-20 | Disposition: A | Discharge status: Home / Self Care | Payer: 59 | Source: Ambulatory Visit | Attending: Urology | Admitting: Urology

## 2020-03-19 DIAGNOSIS — N320 Bladder-neck obstruction: Principal | ICD-10-CM | POA: Diagnosis present

## 2020-03-19 DIAGNOSIS — N401 Enlarged prostate with lower urinary tract symptoms: Secondary | ICD-10-CM | POA: Diagnosis not present

## 2020-03-19 DIAGNOSIS — N403 Nodular prostate with lower urinary tract symptoms: Secondary | ICD-10-CM | POA: Insufficient documentation

## 2020-03-19 DIAGNOSIS — N400 Enlarged prostate without lower urinary tract symptoms: Secondary | ICD-10-CM | POA: Diagnosis not present

## 2020-03-19 DIAGNOSIS — R3912 Poor urinary stream: Secondary | ICD-10-CM | POA: Insufficient documentation

## 2020-03-19 DIAGNOSIS — Z7984 Long term (current) use of oral hypoglycemic drugs: Secondary | ICD-10-CM | POA: Insufficient documentation

## 2020-03-19 DIAGNOSIS — N138 Other obstructive and reflux uropathy: Secondary | ICD-10-CM | POA: Diagnosis not present

## 2020-03-19 DIAGNOSIS — N529 Male erectile dysfunction, unspecified: Secondary | ICD-10-CM | POA: Diagnosis not present

## 2020-03-19 DIAGNOSIS — E119 Type 2 diabetes mellitus without complications: Secondary | ICD-10-CM | POA: Diagnosis not present

## 2020-03-19 DIAGNOSIS — G4730 Sleep apnea, unspecified: Secondary | ICD-10-CM | POA: Diagnosis not present

## 2020-03-19 HISTORY — PX: TRANSURETHRAL RESECTION OF PROSTATE: SHX73

## 2020-03-19 LAB — GLUCOSE, CAPILLARY
Glucose-Capillary: 106 mg/dL — ABNORMAL HIGH (ref 70–99)
Glucose-Capillary: 111 mg/dL — ABNORMAL HIGH (ref 70–99)
Glucose-Capillary: 127 mg/dL — ABNORMAL HIGH (ref 70–99)
Glucose-Capillary: 138 mg/dL — ABNORMAL HIGH (ref 70–99)
Glucose-Capillary: 148 mg/dL — ABNORMAL HIGH (ref 70–99)

## 2020-03-19 SURGERY — TURP (TRANSURETHRAL RESECTION OF PROSTATE)
Anesthesia: General

## 2020-03-19 MED ORDER — INSULIN ASPART 100 UNIT/ML ~~LOC~~ SOLN: 0.0000 [IU] | SUBCUTANEOUS | Status: DC

## 2020-03-19 MED ORDER — CHLORHEXIDINE GLUCONATE 0.12 % MT SOLN
15.0000 mL | Freq: Once | OROMUCOSAL | Status: AC
  Administered 2020-03-19: 15 mL via OROMUCOSAL

## 2020-03-19 MED ORDER — BELLADONNA ALKALOIDS-OPIUM 16.2-60 MG RE SUPP: 1.0000 | Freq: Four times a day (QID) | RECTAL | Status: DC | PRN

## 2020-03-19 MED ORDER — ORAL CARE MOUTH RINSE: 15.0000 mL | Freq: Once | OROMUCOSAL | Status: AC

## 2020-03-19 MED ORDER — MIDAZOLAM HCL 2 MG/2ML IJ SOLN
INTRAMUSCULAR | Status: AC
  Filled 2020-03-19: qty 2

## 2020-03-19 MED ORDER — LIDOCAINE 2% (20 MG/ML) 5 ML SYRINGE
INTRAMUSCULAR | Status: AC
  Filled 2020-03-19: qty 5

## 2020-03-19 MED ORDER — SODIUM CHLORIDE 0.45 % IV SOLN: INTRAVENOUS | Status: DC

## 2020-03-19 MED ORDER — ACETAMINOPHEN 500 MG PO TABS
1000.0000 mg | ORAL_TABLET | Freq: Once | ORAL | Status: AC
  Administered 2020-03-19: 1000 mg via ORAL
  Filled 2020-03-19: qty 2

## 2020-03-19 MED ORDER — ONDANSETRON HCL 4 MG/2ML IJ SOLN: 4.0000 mg | INTRAMUSCULAR | Status: DC | PRN

## 2020-03-19 MED ORDER — OXYCODONE HCL 5 MG/5ML PO SOLN: 5.0000 mg | Freq: Once | ORAL | Status: DC | PRN

## 2020-03-19 MED ORDER — MIDAZOLAM HCL 2 MG/2ML IJ SOLN
INTRAMUSCULAR | Status: DC | PRN
  Administered 2020-03-19: 2 mg via INTRAVENOUS

## 2020-03-19 MED ORDER — OXYCODONE HCL 5 MG PO TABS: 5.0000 mg | ORAL_TABLET | Freq: Once | ORAL | Status: DC | PRN

## 2020-03-19 MED ORDER — LIDOCAINE HCL (CARDIAC) PF 100 MG/5ML IV SOSY
PREFILLED_SYRINGE | INTRAVENOUS | Status: DC | PRN
  Administered 2020-03-19: 60 mg via INTRAVENOUS

## 2020-03-19 MED ORDER — SODIUM CHLORIDE 0.9 % IR SOLN
3000.0000 mL | Status: DC
  Administered 2020-03-19 – 2020-03-20 (×4): 3000 mL

## 2020-03-19 MED ORDER — STERILE WATER FOR IRRIGATION IR SOLN
Status: DC | PRN
  Administered 2020-03-19: 500 mL

## 2020-03-19 MED ORDER — FENTANYL CITRATE (PF) 250 MCG/5ML IJ SOLN
INTRAMUSCULAR | Status: AC
  Filled 2020-03-19: qty 5

## 2020-03-19 MED ORDER — GLYCOPYRROLATE PF 0.2 MG/ML IJ SOSY
PREFILLED_SYRINGE | INTRAMUSCULAR | Status: DC | PRN
  Administered 2020-03-19 (×2): .1 mg via INTRAVENOUS

## 2020-03-19 MED ORDER — 0.9 % SODIUM CHLORIDE (POUR BTL) OPTIME
TOPICAL | Status: DC | PRN
  Administered 2020-03-19: 1000 mL

## 2020-03-19 MED ORDER — DIPHENHYDRAMINE HCL 50 MG/ML IJ SOLN: 12.5000 mg | Freq: Four times a day (QID) | INTRAMUSCULAR | Status: DC | PRN

## 2020-03-19 MED ORDER — SODIUM CHLORIDE 0.9 % IR SOLN
Status: DC | PRN
  Administered 2020-03-19: 54000 mL

## 2020-03-19 MED ORDER — PROPOFOL 10 MG/ML IV BOLUS
INTRAVENOUS | Status: DC | PRN
  Administered 2020-03-19: 200 mg via INTRAVENOUS

## 2020-03-19 MED ORDER — HYDROCODONE-ACETAMINOPHEN 5-325 MG PO TABS: 1.0000 | ORAL_TABLET | ORAL | Status: DC | PRN

## 2020-03-19 MED ORDER — ONDANSETRON HCL 4 MG/2ML IJ SOLN
INTRAMUSCULAR | Status: AC
  Filled 2020-03-19: qty 2

## 2020-03-19 MED ORDER — DOCUSATE SODIUM 100 MG PO CAPS
100.0000 mg | ORAL_CAPSULE | Freq: Two times a day (BID) | ORAL | Status: DC
  Administered 2020-03-19 – 2020-03-20 (×3): 100 mg via ORAL
  Filled 2020-03-19 (×2): qty 1

## 2020-03-19 MED ORDER — SODIUM CHLORIDE 0.9% FLUSH
3.0000 mL | Freq: Two times a day (BID) | INTRAVENOUS | Status: DC
  Administered 2020-03-19 – 2020-03-20 (×2): 3 mL via INTRAVENOUS

## 2020-03-19 MED ORDER — CHLORHEXIDINE GLUCONATE CLOTH 2 % EX PADS
6.0000 | MEDICATED_PAD | Freq: Every day | CUTANEOUS | Status: DC
  Administered 2020-03-20: 6 via TOPICAL

## 2020-03-19 MED ORDER — ISOPROPYL ALCOHOL 70 % SOLN
Status: AC
  Filled 2020-03-19: qty 480

## 2020-03-19 MED ORDER — INDIGOTINDISULFONATE SODIUM 8 MG/ML IJ SOLN
INTRAMUSCULAR | Status: AC
  Filled 2020-03-19: qty 5

## 2020-03-19 MED ORDER — HYDROMORPHONE HCL 1 MG/ML IJ SOLN: 0.2500 mg | INTRAMUSCULAR | Status: DC | PRN

## 2020-03-19 MED ORDER — CEFAZOLIN SODIUM-DEXTROSE 2-4 GM/100ML-% IV SOLN
2.0000 g | Freq: Once | INTRAVENOUS | Status: AC
  Administered 2020-03-19: 2 g via INTRAVENOUS
  Filled 2020-03-19: qty 100

## 2020-03-19 MED ORDER — GLYCOPYRROLATE PF 0.2 MG/ML IJ SOSY
PREFILLED_SYRINGE | INTRAMUSCULAR | Status: AC
  Filled 2020-03-19: qty 1

## 2020-03-19 MED ORDER — SODIUM CHLORIDE 0.9 % IV SOLN: 250.0000 mL | INTRAVENOUS | Status: DC | PRN

## 2020-03-19 MED ORDER — DIPHENHYDRAMINE HCL 12.5 MG/5ML PO ELIX: 12.5000 mg | ORAL_SOLUTION | Freq: Four times a day (QID) | ORAL | Status: DC | PRN

## 2020-03-19 MED ORDER — PROMETHAZINE HCL 25 MG/ML IJ SOLN: 6.2500 mg | INTRAMUSCULAR | Status: DC | PRN

## 2020-03-19 MED ORDER — LACTATED RINGERS IV SOLN: INTRAVENOUS | Status: DC

## 2020-03-19 MED ORDER — INDIGOTINDISULFONATE SODIUM 8 MG/ML IJ SOLN
INTRAMUSCULAR | Status: DC | PRN
  Administered 2020-03-19: 5 mL via INTRAVENOUS

## 2020-03-19 MED ORDER — DEXAMETHASONE SODIUM PHOSPHATE 10 MG/ML IJ SOLN
INTRAMUSCULAR | Status: DC | PRN
  Administered 2020-03-19: 8 mg via INTRAVENOUS

## 2020-03-19 MED ORDER — DEXAMETHASONE SODIUM PHOSPHATE 10 MG/ML IJ SOLN
INTRAMUSCULAR | Status: AC
  Filled 2020-03-19: qty 1

## 2020-03-19 MED ORDER — KETOROLAC TROMETHAMINE 30 MG/ML IJ SOLN: 30.0000 mg | Freq: Once | INTRAMUSCULAR | Status: DC | PRN

## 2020-03-19 MED ORDER — SODIUM CHLORIDE 0.9% FLUSH: 3.0000 mL | INTRAVENOUS | Status: DC | PRN

## 2020-03-19 MED ORDER — PROPOFOL 10 MG/ML IV BOLUS
INTRAVENOUS | Status: AC
  Filled 2020-03-19: qty 40

## 2020-03-19 MED ORDER — FENTANYL CITRATE (PF) 250 MCG/5ML IJ SOLN
INTRAMUSCULAR | Status: DC | PRN
Start: 2020-03-19 — End: 2020-03-19
  Administered 2020-03-19: 25 ug via INTRAVENOUS
  Administered 2020-03-19: 50 ug via INTRAVENOUS
  Administered 2020-03-19: 25 ug via INTRAVENOUS
  Administered 2020-03-19: 75 ug via INTRAVENOUS
  Administered 2020-03-19: 50 ug via INTRAVENOUS
  Administered 2020-03-19: 25 ug via INTRAVENOUS

## 2020-03-19 MED ORDER — ONDANSETRON HCL 4 MG/2ML IJ SOLN
INTRAMUSCULAR | Status: DC | PRN
  Administered 2020-03-19: 4 mg via INTRAVENOUS

## 2020-03-19 SURGICAL SUPPLY — 17 items
BAG DRN RND TRDRP ANRFLXCHMBR (UROLOGICAL SUPPLIES) ×1
BAG URINE DRAIN 2000ML AR STRL (UROLOGICAL SUPPLIES) ×2 IMPLANT
BAG URO CATCHER STRL LF (MISCELLANEOUS) ×2 IMPLANT
CATH HEMA 3WAY 30CC 22FR COUDE (CATHETERS) ×1 IMPLANT
GLOVE BIOGEL M STRL SZ7.5 (GLOVE) ×2 IMPLANT
GOWN STRL REUS W/TWL LRG LVL3 (GOWN DISPOSABLE) ×2 IMPLANT
HOLDER FOLEY CATH W/STRAP (MISCELLANEOUS) ×1 IMPLANT
KIT TURNOVER KIT A (KITS) IMPLANT
LOOP CUT BIPOLAR 24F LRG (ELECTROSURGICAL) ×2 IMPLANT
MANIFOLD NEPTUNE II (INSTRUMENTS) ×2 IMPLANT
PACK CYSTO (CUSTOM PROCEDURE TRAY) ×2 IMPLANT
PENCIL SMOKE EVACUATOR (MISCELLANEOUS) IMPLANT
SYR 30ML LL (SYRINGE) ×2 IMPLANT
SYR TOOMEY IRRIG 70ML (MISCELLANEOUS) ×2
SYRINGE TOOMEY IRRIG 70ML (MISCELLANEOUS) ×1 IMPLANT
TUBING CONNECTING 10 (TUBING) ×2 IMPLANT
TUBING UROLOGY SET (TUBING) ×2 IMPLANT

## 2020-03-19 NOTE — Progress Notes (Signed)
Patient ID: Blake Curtis, male   DOB: Dec 14, 1960, 59 y.o.   MRN: [id number]  Post-op note  Subjective: The patient is doing well.  No complaints.  Objective: Vital signs in last 24 hours: Temp:  [97.6 F (36.4 C)-98.6 F (37 C)] 97.7 F (36.5 C) (10/25 1626) Pulse Rate:  [74-85] 76 (10/25 1626) Resp:  [10-17] 17 (10/25 1626) BP: (124-162)/(81-95) 124/81 (10/25 1626) SpO2:  [96 %-100 %] 100 % (10/25 1626) Weight:  [117.9 kg] 117.9 kg (10/25 0718)  Intake/Output from previous day: No intake/output data recorded. Intake/Output this shift: Total I/O In: 8372 [P.O.:480; I.V.:1400; Other:1800] Out: 9100 [Urine:9100]  Physical Exam:  General: Alert and oriented. GU: Urine light pink on mild CBI after traction removed  Lab Results: No results for input(s): HGB, HCT in the last 72 hours.  Assessment/Plan: POD#0   1) Continue to monitor, continue to wean CBI overnight.   Roxy Horseman, Brooke Bonito. MD   LOS: 0 days   [name] 03/19/2020, 6:40 PM

## 2020-03-19 NOTE — Anesthesia Preprocedure Evaluation (Addendum)
Anesthesia Evaluation  Patient identified by MRN, date of birth, ID band Patient awake    Reviewed: Allergy & Precautions, NPO status , Patient's Chart, lab work & pertinent test results  Airway Mallampati: II  TM Distance: >3 FB Neck ROM: Full    Dental no notable dental hx.    Pulmonary sleep apnea ,    Pulmonary exam normal breath sounds clear to auscultation       Cardiovascular Normal cardiovascular exam Rhythm:Regular Rate:Normal  ECG: SB, rate 55   Neuro/Psych negative neurological ROS  negative psych ROS   GI/Hepatic negative GI ROS, Neg liver ROS,   Endo/Other  diabetes, Oral Hypoglycemic Agents  Renal/GU negative Renal ROS     Musculoskeletal negative musculoskeletal ROS (+)   Abdominal (+) + obese,   Peds  Hematology negative hematology ROS (+)   Anesthesia Other Findings BLADDER OUTLET OBSTRUCTION BENIGN PROSTATIC HYPERPLASIA  Reproductive/Obstetrics                            Anesthesia Physical Anesthesia Plan  ASA: II  Anesthesia Plan: General   Post-op Pain Management:    Induction: Intravenous  PONV Risk Score and Plan: 3 and Ondansetron, Dexamethasone, Midazolam and Treatment may vary due to age or medical condition  Airway Management Planned: LMA  Additional Equipment:   Intra-op Plan:   Post-operative Plan: Extubation in OR  Informed Consent: I have reviewed the patients History and Physical, chart, labs and discussed the procedure including the risks, benefits and alternatives for the proposed anesthesia with the patient or authorized representative who has indicated his/her understanding and acceptance.     Dental advisory given  Plan Discussed with: CRNA  Anesthesia Plan Comments:        Anesthesia Quick Evaluation

## 2020-03-19 NOTE — Anesthesia Postprocedure Evaluation (Signed)
Anesthesia Post Note  Patient: Blake Curtis  Procedure(s) Performed: TRANSURETHRAL RESECTION OF THE PROSTATE (TURP) WITH CYSTOSCOPY (N/A )     Patient location during evaluation: PACU Anesthesia Type: General Level of consciousness: awake and alert Pain management: pain level controlled Vital Signs Assessment: post-procedure vital signs reviewed and stable Respiratory status: spontaneous breathing, nonlabored ventilation, respiratory function stable and patient connected to nasal cannula oxygen Cardiovascular status: blood pressure returned to baseline and stable Postop Assessment: no apparent nausea or vomiting Anesthetic complications: no   No complications documented.  Last Vitals:  Vitals:   03/19/20 1200 03/19/20 1232  BP: 129/81 124/86  Pulse: 74 75  Resp: 12 14  Temp: 36.5 C 36.4 C  SpO2: 96% 100%    Last Pain:  Vitals:   03/19/20 1232  TempSrc: Oral  PainSc:                  Karyl Kinnier Mikya Don

## 2020-03-19 NOTE — Transfer of Care (Signed)
Immediate Anesthesia Transfer of Care Note  Patient: Blake Curtis  Procedure(s) Performed: TRANSURETHRAL RESECTION OF THE PROSTATE (TURP) WITH CYSTOSCOPY (N/A )  Patient Location: PACU  Anesthesia Type:General  Level of Consciousness: awake, drowsy and patient cooperative  Airway & Oxygen Therapy: Patient Spontanous Breathing and Patient connected to face mask oxygen  Post-op Assessment: Report given to RN and Post -op Vital signs reviewed and stable  Post vital signs: Reviewed and stable  Last Vitals:  Vitals Value Taken Time  BP 146/95 03/19/20 1109  Temp    Pulse 83 03/19/20 1111  Resp 14 03/19/20 1111  SpO2 100 % 03/19/20 1111  Vitals shown include unvalidated device data.  Last Pain:  Vitals:   03/19/20 0727  TempSrc:   PainSc: 0-No pain         Complications: No complications documented.

## 2020-03-19 NOTE — Op Note (Signed)
Preoperative diagnosis: Bladder outlet obstruction secondary to BPH  Postoperative diagnosis:  Bladder outlet obstruction secondary to BPH  Procedure:  Cystoscopy Transurethral resection of the prostate (bipolar)  Surgeon: Pryor Curia. M.D.  Anesthesia: General  Complications: None  EBL: Minimal  Specimens: Prostate chips  Disposition of specimens: Pathology  Indication: Blake Curtis is a patient with bladder outlet obstruction secondary to benign prostatic hyperplasia. After reviewing the management options for treatment, he elected to proceed with the above surgical procedure(s). We have discussed the potential benefits and risks of the procedure, side effects of the proposed treatment, the likelihood of the patient achieving the goals of the procedure, and any potential problems that might occur during the procedure or recuperation. Informed consent has been obtained.  Description of procedure:  The patient was taken to the operating room and general anesthesia was induced.  The patient was placed in the dorsal lithotomy position, prepped and draped in the usual sterile fashion, and preoperative antibiotics were administered. A preoperative time-out was performed.   Cystourethroscopy was performed.  The patient's urethra was examined and demonstrated bilobar prostatic hypertrophy with a very large intravesical median lobe.   The bladder was then systematically examined in its entirety. There was no evidence of any bladder tumors, stones, or other mucosal pathology.  The ureteral orifices were identified and marked so as to be avoided during the procedure. Indigo carmine was administered to help identify them due to his large protruding intravesical lobe.  The prostate adenoma was then resected utilizing loop cautery resection with the bipolar cutting loop.  The prostate adenoma of the intravesical median lobe was first resected and then from the bladder neck back to  the verumontanum was resected beginning at the six o'clock position and then extended to include the right and left lobes of the prostate and anterior prostate. Care was taken not to resect distal to the verumontanum.  Hemostasis was then achieved with the cautery and the bladder was emptied and reinspected with no significant bleeding noted at the end of the procedure.    A 3 way catheter was then placed into the bladder and placed on continuous bladder irrigation.  The patient appeared to tolerate the procedure well and without complications.  The patient was able to be awakened and transferred to the recovery unit in satisfactory condition.

## 2020-03-19 NOTE — Progress Notes (Signed)
Pt ambulated on hall way at this time, tolerated well.

## 2020-03-19 NOTE — Anesthesia Procedure Notes (Signed)
Procedure Name: LMA Insertion Date/Time: 03/19/2020 8:52 AM Performed by: Raenette Rover, CRNA Pre-anesthesia Checklist: Patient identified, Emergency Drugs available, Suction available and Patient being monitored Patient Re-evaluated:Patient Re-evaluated prior to induction Oxygen Delivery Method: Circle system utilized Preoxygenation: Pre-oxygenation with 100% oxygen Induction Type: IV induction LMA: LMA with gastric port inserted LMA Size: 5.0 Number of attempts: 1 Placement Confirmation: positive ETCO2 and breath sounds checked- equal and bilateral Tube secured with: Tape Dental Injury: Teeth and Oropharynx as per pre-operative assessment

## 2020-03-20 ENCOUNTER — Encounter (HOSPITAL_COMMUNITY): Payer: Self-pay | Admitting: Urology

## 2020-03-20 ENCOUNTER — Other Ambulatory Visit (HOSPITAL_COMMUNITY): Payer: Self-pay | Admitting: Urology

## 2020-03-20 DIAGNOSIS — E119 Type 2 diabetes mellitus without complications: Secondary | ICD-10-CM | POA: Diagnosis not present

## 2020-03-20 DIAGNOSIS — N401 Enlarged prostate with lower urinary tract symptoms: Secondary | ICD-10-CM | POA: Diagnosis not present

## 2020-03-20 DIAGNOSIS — Z7984 Long term (current) use of oral hypoglycemic drugs: Secondary | ICD-10-CM | POA: Diagnosis not present

## 2020-03-20 DIAGNOSIS — N403 Nodular prostate with lower urinary tract symptoms: Secondary | ICD-10-CM | POA: Diagnosis not present

## 2020-03-20 DIAGNOSIS — N529 Male erectile dysfunction, unspecified: Secondary | ICD-10-CM | POA: Diagnosis not present

## 2020-03-20 DIAGNOSIS — R3912 Poor urinary stream: Secondary | ICD-10-CM | POA: Diagnosis not present

## 2020-03-20 DIAGNOSIS — N320 Bladder-neck obstruction: Secondary | ICD-10-CM | POA: Diagnosis not present

## 2020-03-20 LAB — GLUCOSE, CAPILLARY
Glucose-Capillary: 100 mg/dL — ABNORMAL HIGH (ref 70–99)
Glucose-Capillary: 98 mg/dL (ref 70–99)
Glucose-Capillary: 117 mg/dL — ABNORMAL HIGH (ref 70–99)

## 2020-03-20 MED ORDER — PHENAZOPYRIDINE HCL 200 MG PO TABS: 200.0000 mg | ORAL_TABLET | Freq: Three times a day (TID) | ORAL | 0 refills | Status: DC | PRN

## 2020-03-20 MED ORDER — DOCUSATE SODIUM 100 MG PO CAPS: 100.0000 mg | ORAL_CAPSULE | Freq: Two times a day (BID) | ORAL | 0 refills | Status: DC

## 2020-03-20 MED ORDER — PHENAZOPYRIDINE HCL 200 MG PO TABS
200.0000 mg | ORAL_TABLET | Freq: Three times a day (TID) | ORAL | Status: DC | PRN
  Administered 2020-03-20: 200 mg via ORAL
  Filled 2020-03-20 (×2): qty 1

## 2020-03-20 MED FILL — PHENAZOPYRIDINE 200 MG TAB: 200 | 10 days supply | Qty: 30 | Fill #0

## 2020-03-20 NOTE — Progress Notes (Signed)
Patient ambulated on hall way this morning, tolerated well.

## 2020-03-20 NOTE — Progress Notes (Signed)
Discharge instructions reviewed with patient and spouse. Teaching complete. Acknowledged understanding. Transported home with wife in stable condition and voiding. SRP, RN

## 2020-03-20 NOTE — Discharge Summary (Signed)
°  Date of admission: 03/19/2020  Date of discharge: 03/20/2020  Admission diagnosis: Bladder outlet obstruction  Discharge diagnosis: Bladder outlet obstruction  Secondary diagnoses: Diabetes  History and Physical: For full details, please see admission history and physical. Briefly, Blake Curtis is a 59 y.o. year old patient with bladder outlet obstruction.   Hospital Course: He is s/p TURP on 03/19/20.  He was maintained on CBI overnight and his urine had cleared by POD #1.  His catheter was removed and he underwent a successful voiding trial.  Disposition: Home  Discharge instruction: The patient was instructed to be ambulatory but told to refrain from heavy lifting, strenuous activity, or driving.   Discharge medications:  Allergies as of 03/20/2020      Reactions   Losartan Cough      Medication List    STOP taking these medications   aspirin 81 MG tablet   hydroxychloroquine 200 MG tablet Commonly known as: PLAQUENIL   multivitamin with minerals Tabs tablet   tamsulosin 0.4 MG Caps capsule Commonly known as: FLOMAX     TAKE these medications   metFORMIN 500 MG tablet Commonly known as: GLUCOPHAGE Take 500 mg by mouth daily.   ofloxacin 0.3 % OTIC solution Commonly known as: FLOXIN 5 drops bid in left ear only.   phenazopyridine 200 MG tablet Commonly known as: Pyridium Take 1 tablet (200 mg total) by mouth 3 (three) times daily as needed for pain.       Followup:   Follow-up Information    Raynelle Bring, MD Follow up.   Specialty: Urology Contact information: [address] [place] [id number] [phone]

## 2020-03-20 NOTE — Progress Notes (Signed)
Patient ID: Blake Curtis, male   DOB: Apr 30, 1961, 59 y.o.   MRN: [id number]  1 Day Post-Op Subjective: Pt doing well.  No problems overnight.  Objective: Vital signs in last 24 hours: Temp:  [97.6 F (36.4 C)-98.6 F (37 C)] 98.3 F (36.8 C) (10/26 0410) Pulse Rate:  [74-85] 79 (10/26 0410) Resp:  [10-19] 18 (10/26 0410) BP: (121-146)/(77-95) 121/85 (10/26 0410) SpO2:  [96 %-100 %] 98 % (10/26 0410)  Intake/Output from previous day: 10/25 0701 - 10/26 0700 In: 4611.7 [P.O.:480; I.V.:2331.7] Out: 15650 [Urine:15650] Intake/Output this shift: No intake/output data recorded.  Physical Exam:  General: Alert and oriented GU: Urine clear on minimal drip, urine remains clear once drip stopped  Lab Results:  Assessment/Plan: 1) BOO s/p TURP: Catheter removed.  Voiding trial this morning.  Likely d/c later this morning if passes voiding trial.    LOS: 0 days   Blake Curtis 03/20/2020, 7:25 AM

## 2020-03-20 NOTE — Discharge Instructions (Signed)
1. You may see some blood in the urine and may have some burning with urination for 48-72 hours. You also may notice that you have to urinate more frequently or urgently after your procedure which is normal.  2. You should call should you develop an inability urinate, fever > 101, persistent nausea and vomiting that prevents you from eating or drinking to stay hydrated.        3.  Avoid heavy lifting or strenuous activity for 2 weeks.

## 2020-03-21 LAB — SURGICAL PATHOLOGY

## 2020-03-26 DIAGNOSIS — E119 Type 2 diabetes mellitus without complications: Secondary | ICD-10-CM | POA: Diagnosis not present

## 2020-04-02 DIAGNOSIS — R350 Frequency of micturition: Secondary | ICD-10-CM | POA: Diagnosis not present

## 2020-04-02 DIAGNOSIS — R310 Gross hematuria: Secondary | ICD-10-CM | POA: Diagnosis not present

## 2020-04-17 ENCOUNTER — Other Ambulatory Visit (HOSPITAL_COMMUNITY): Payer: Self-pay | Admitting: Urology

## 2020-04-17 DIAGNOSIS — N401 Enlarged prostate with lower urinary tract symptoms: Secondary | ICD-10-CM | POA: Diagnosis not present

## 2020-04-17 DIAGNOSIS — R3912 Poor urinary stream: Secondary | ICD-10-CM | POA: Diagnosis not present

## 2020-04-17 MED FILL — SILDENAFIL CITRATE 100 MG T: 100 | 30 days supply | Qty: 6 | Fill #0

## 2020-04-17 MED FILL — DOXYCYCLINE HYCLATE 100 MG: 100 | 7 days supply | Qty: 14 | Fill #0

## 2020-05-01 DIAGNOSIS — Z0000 Encounter for general adult medical examination without abnormal findings: Secondary | ICD-10-CM | POA: Diagnosis not present

## 2020-05-01 DIAGNOSIS — I10 Essential (primary) hypertension: Secondary | ICD-10-CM | POA: Diagnosis not present

## 2020-05-01 DIAGNOSIS — Z125 Encounter for screening for malignant neoplasm of prostate: Secondary | ICD-10-CM | POA: Diagnosis not present

## 2020-05-07 DIAGNOSIS — I10 Essential (primary) hypertension: Secondary | ICD-10-CM | POA: Diagnosis not present

## 2020-05-07 DIAGNOSIS — N401 Enlarged prostate with lower urinary tract symptoms: Secondary | ICD-10-CM | POA: Diagnosis not present

## 2020-05-07 DIAGNOSIS — R82998 Other abnormal findings in urine: Secondary | ICD-10-CM | POA: Diagnosis not present

## 2020-05-07 DIAGNOSIS — Z23 Encounter for immunization: Secondary | ICD-10-CM | POA: Diagnosis not present

## 2020-05-07 DIAGNOSIS — Z1212 Encounter for screening for malignant neoplasm of rectum: Secondary | ICD-10-CM | POA: Diagnosis not present

## 2020-05-07 DIAGNOSIS — E1169 Type 2 diabetes mellitus with other specified complication: Secondary | ICD-10-CM | POA: Diagnosis not present

## 2020-05-07 DIAGNOSIS — E669 Obesity, unspecified: Secondary | ICD-10-CM | POA: Diagnosis not present

## 2020-05-07 DIAGNOSIS — Z0000 Encounter for general adult medical examination without abnormal findings: Secondary | ICD-10-CM | POA: Diagnosis not present

## 2020-05-07 DIAGNOSIS — R972 Elevated prostate specific antigen [PSA]: Secondary | ICD-10-CM | POA: Diagnosis not present

## 2020-06-22 IMAGING — CT CT CHEST W/ CM
2 of 3 series · 15 of 36 positions shown, 18 images · IV contrast (omnipaque)
Comparison: 11/24/2017, 12/01/2017

CLINICAL DATA: Dysphagia, history of sarcoidosis

EXAM:
CT CHEST WITH CONTRAST
TECHNIQUE: Multidetector CT imaging of the chest was performed during
intravenous contrast administration.
CONTRAST:  80mL OMNIPAQUE IOHEXOL 300 MG/ML  SOLN

[Series 3: chest w/cm 5.0 i31f 1 · axial · 0.80mm/px · z∈[-466,-166]mm · 12 of 72 slices shown, 15 images]
[im 6/72  mediastinal]
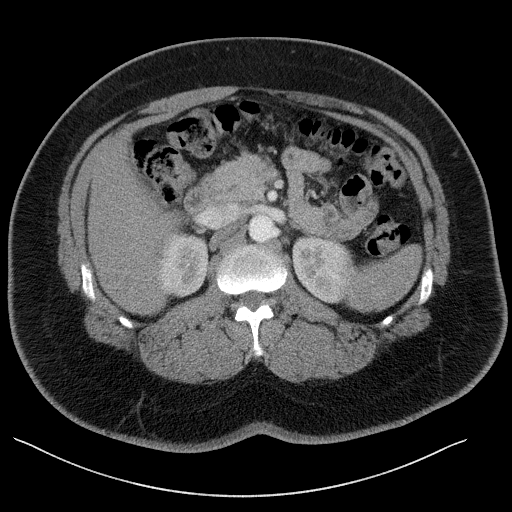
[im 6/72  lung]
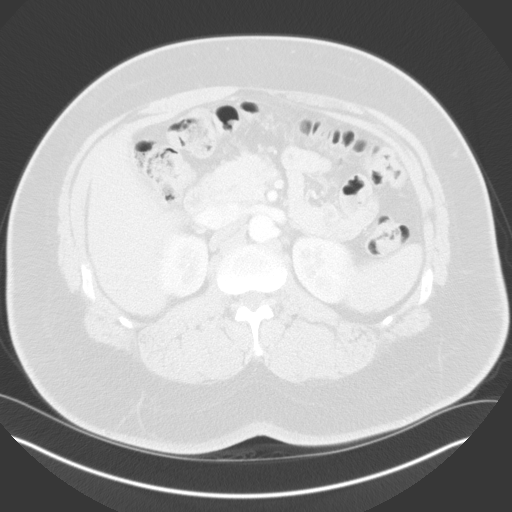
[im 11/72  lung]
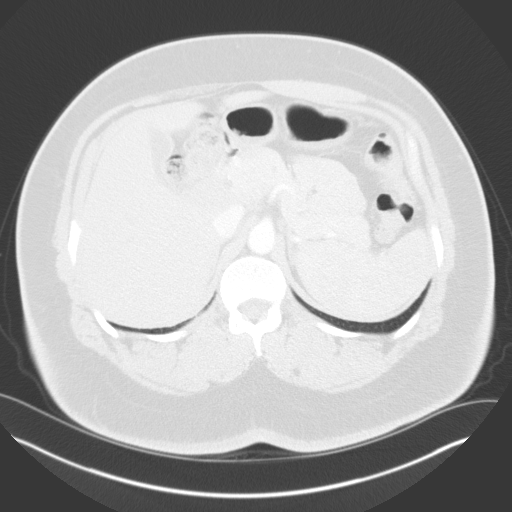
[im 16/72  lung]
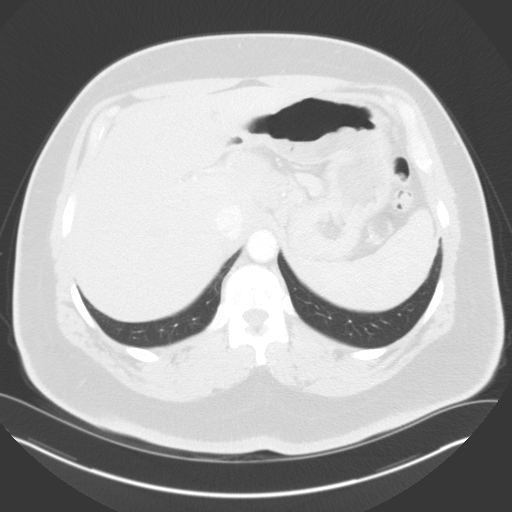
[im 22/72  lung]
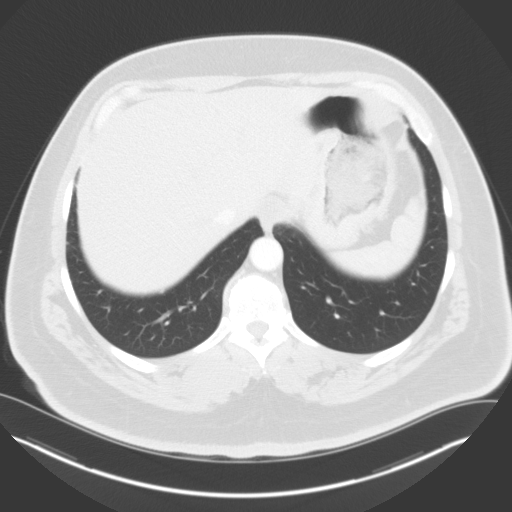
[im 27/72  mediastinal]
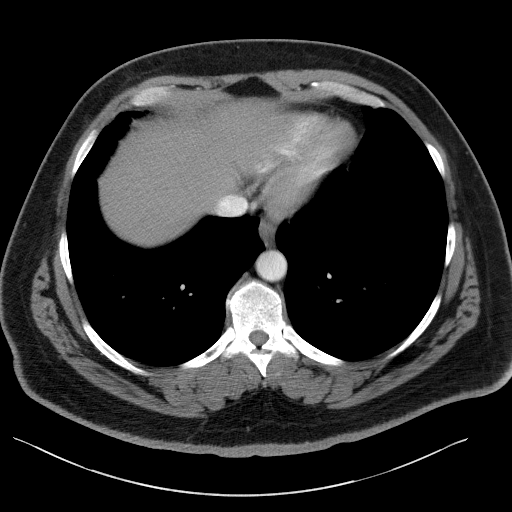
[im 27/72  lung]
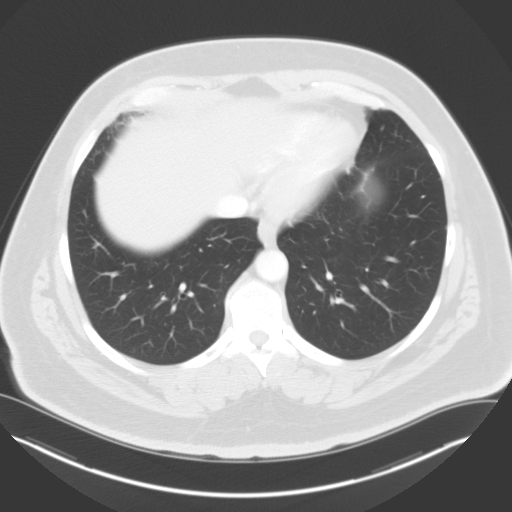
[im 32/72  lung]
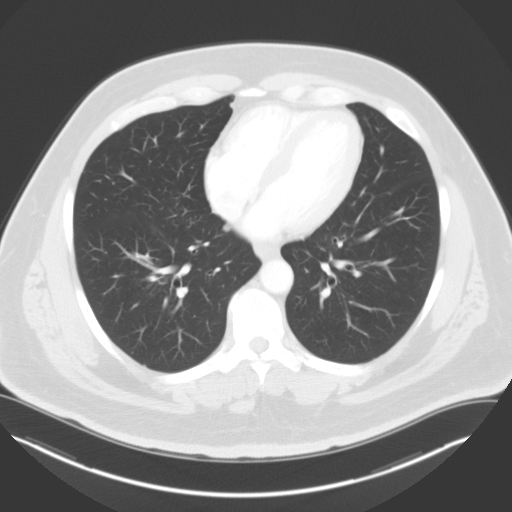
[im 40/72  lung]
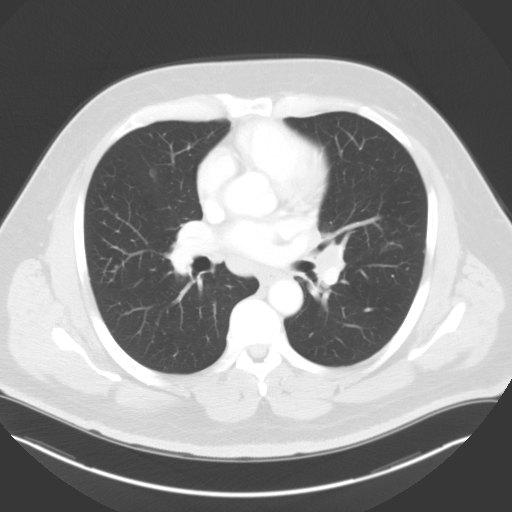
[im 45/72  lung]
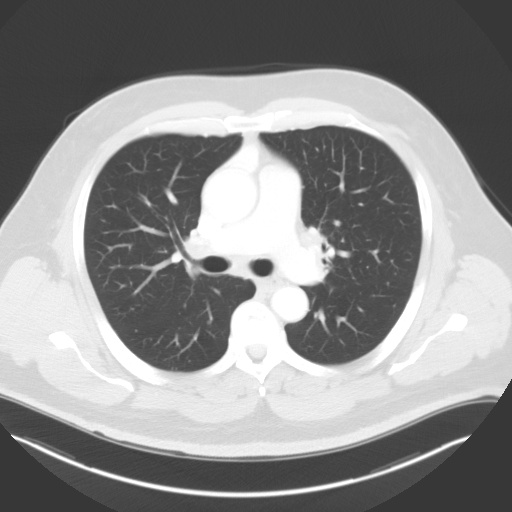
[im 50/72  mediastinal]
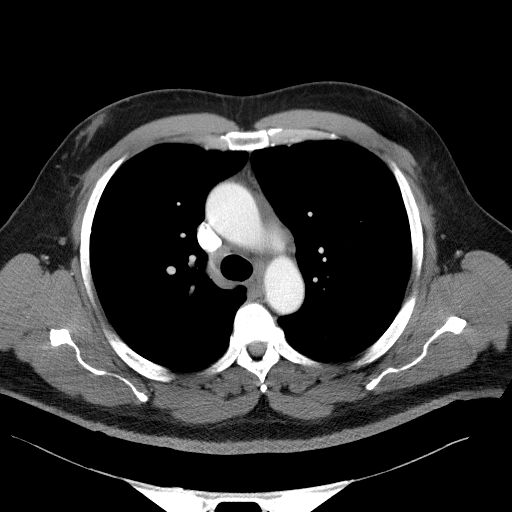
[im 50/72  lung]
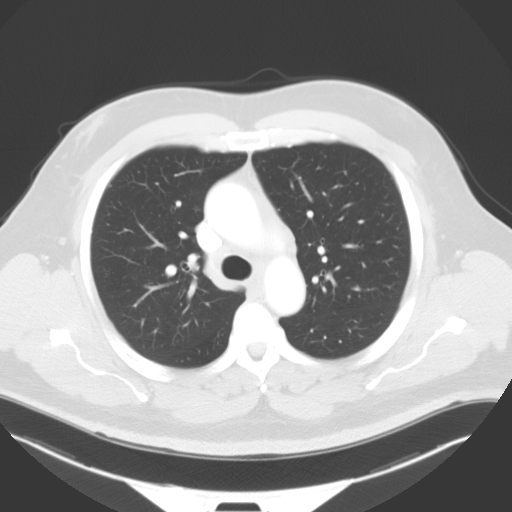
[im 56/72  lung]
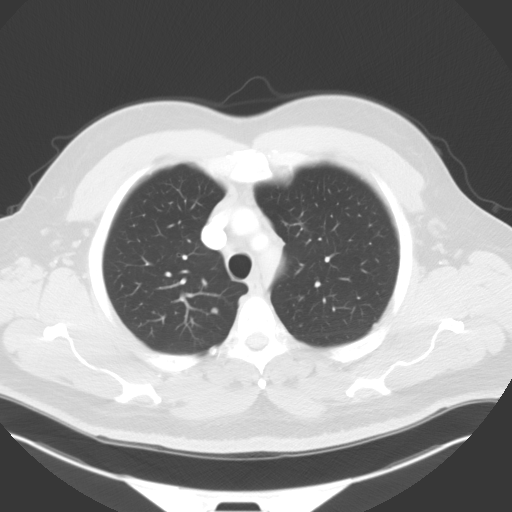
[im 61/72  lung]
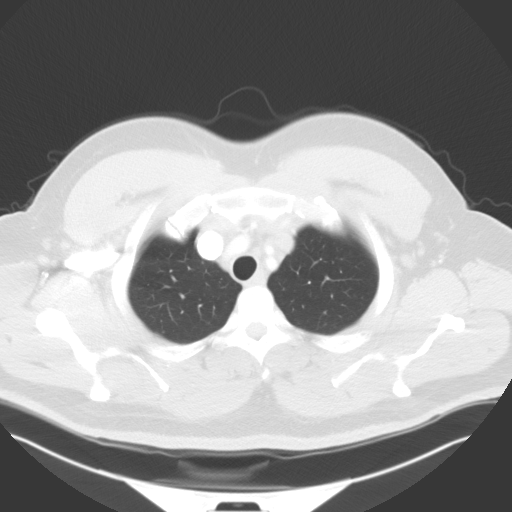
[im 66/72  lung]
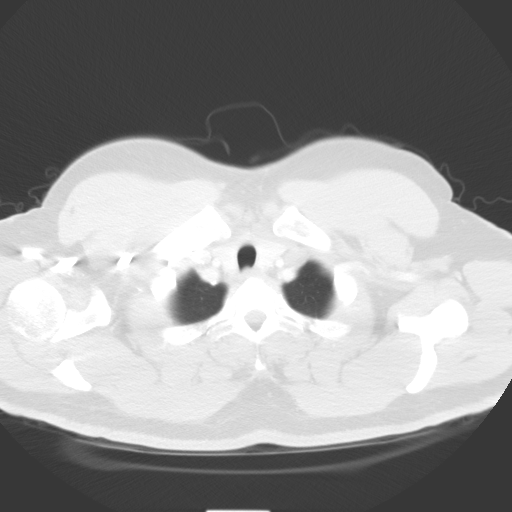

[Series 6: coronal · coronal · 0.70mm/px · 3 of 91 slices shown]
[im 19/91  lung]
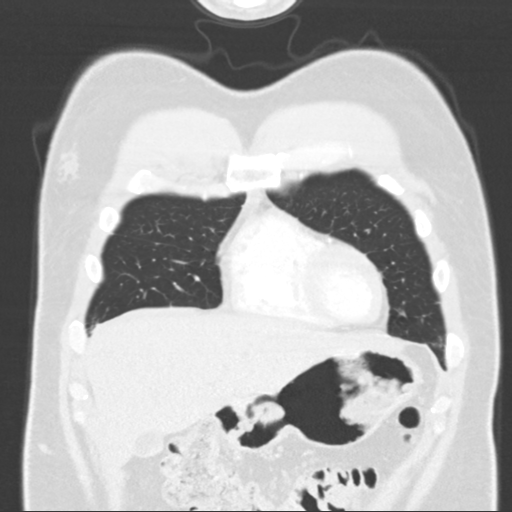
[im 37/91  lung]
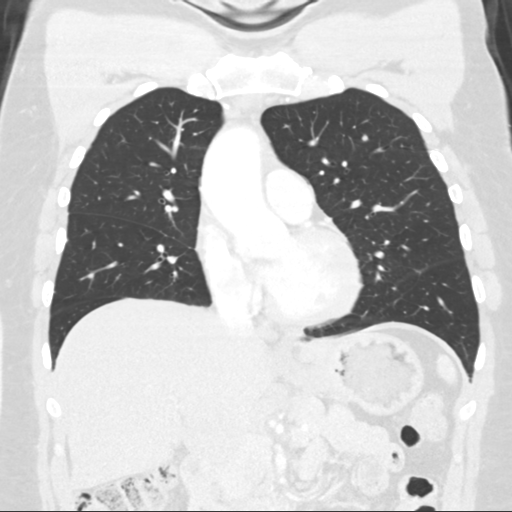
[im 55/91  lung]
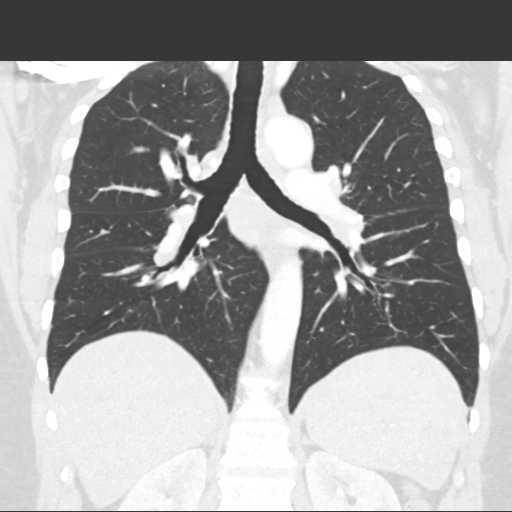

[15 of 36 positions shown; findings below may reference images not displayed]

FINDINGS: Cardiovascular: Aortic atherosclerosis. Normal heart size. No
pericardial effusion.

Mediastinum/Nodes: Unchanged enlarged mediastinal and bilateral
hilar lymph nodes. Stable subcentimeter hypodense nodule of the left
lobe of the thyroid. Trachea, and esophagus demonstrate no
significant findings.

Lungs/Pleura: There are numerous bilateral pulmonary nodules,
measuring approximately 6 mm or smaller, stable compared to prior
examinations dating back to 4779 and benign. No pleural effusion or
pneumothorax.

Upper Abdomen: No acute abnormality.

Musculoskeletal: No chest wall mass or suspicious bone lesions
identified.
IMPRESSION: 1.  No CT abnormality of the chest to explain dysphagia.

2. Numerous bilateral pulmonary nodules, stable and benign. These
are generally nonspecific without characteristic appearance or
distribution of nodularity to suggest sarcoidosis although generally
in keeping with the reported diagnosis.

3. Unchanged enlarged mediastinal and hilar lymph nodes, consistent
with sarcoidosis.

4.  Aortic atherosclerosis.

## 2020-09-03 ENCOUNTER — Other Ambulatory Visit (HOSPITAL_COMMUNITY): Payer: Self-pay

## 2020-09-07 ENCOUNTER — Other Ambulatory Visit (HOSPITAL_COMMUNITY): Payer: Self-pay

## 2020-09-10 ENCOUNTER — Other Ambulatory Visit (HOSPITAL_COMMUNITY): Payer: Self-pay

## 2020-09-10 MED ORDER — METFORMIN HCL 500 MG PO TABS
500.0000 mg | ORAL_TABLET | Freq: Two times a day (BID) | ORAL | 3 refills | Status: DC
  Filled 2020-09-10: qty 180, 90d supply, fill #0
  Filled 2021-07-24: qty 180, 90d supply, fill #1

## 2020-09-12 ENCOUNTER — Other Ambulatory Visit (HOSPITAL_COMMUNITY): Payer: Self-pay

## 2020-11-30 ENCOUNTER — Other Ambulatory Visit (HOSPITAL_COMMUNITY): Payer: Self-pay

## 2021-04-06 MED FILL — Sildenafil Citrate Tab 100 MG: ORAL | 10 days supply | Qty: 10 | Fill #0 | Status: CN

## 2021-04-07 ENCOUNTER — Other Ambulatory Visit (HOSPITAL_COMMUNITY): Payer: Self-pay

## 2021-04-08 ENCOUNTER — Other Ambulatory Visit (HOSPITAL_COMMUNITY): Payer: Self-pay

## 2021-04-19 ENCOUNTER — Other Ambulatory Visit (HOSPITAL_COMMUNITY): Payer: Self-pay

## 2021-07-24 ENCOUNTER — Other Ambulatory Visit (HOSPITAL_COMMUNITY): Payer: Self-pay

## 2021-08-26 ENCOUNTER — Other Ambulatory Visit (HOSPITAL_COMMUNITY): Payer: Self-pay

## 2021-08-26 MED ORDER — OLMESARTAN MEDOXOMIL 20 MG PO TABS
20.0000 mg | ORAL_TABLET | Freq: Every day | ORAL | 3 refills | Status: DC
  Filled 2021-08-26: qty 90, 90d supply, fill #0
  Filled 2021-12-18: qty 90, 90d supply, fill #1
  Filled 2022-07-30: qty 90, 90d supply, fill #2

## 2021-08-28 ENCOUNTER — Other Ambulatory Visit (HOSPITAL_COMMUNITY): Payer: Self-pay

## 2021-08-28 ENCOUNTER — Ambulatory Visit (INDEPENDENT_AMBULATORY_CARE_PROVIDER_SITE_OTHER): Payer: No Typology Code available for payment source | Admitting: Internal Medicine

## 2021-08-28 ENCOUNTER — Encounter: Payer: Self-pay | Admitting: Internal Medicine

## 2021-08-28 VITALS — BP 110/70 | HR 69 | Ht 74.0 in | Wt 256.0 lb

## 2021-08-28 DIAGNOSIS — I152 Hypertension secondary to endocrine disorders: Secondary | ICD-10-CM

## 2021-08-28 DIAGNOSIS — R072 Precordial pain: Secondary | ICD-10-CM

## 2021-08-28 DIAGNOSIS — E119 Type 2 diabetes mellitus without complications: Secondary | ICD-10-CM

## 2021-08-28 DIAGNOSIS — I700 Atherosclerosis of aorta: Secondary | ICD-10-CM

## 2021-08-28 DIAGNOSIS — E785 Hyperlipidemia, unspecified: Secondary | ICD-10-CM

## 2021-08-28 DIAGNOSIS — E1159 Type 2 diabetes mellitus with other circulatory complications: Secondary | ICD-10-CM

## 2021-08-28 DIAGNOSIS — E1169 Type 2 diabetes mellitus with other specified complication: Secondary | ICD-10-CM

## 2021-08-28 MED ORDER — ATORVASTATIN CALCIUM 40 MG PO TABS
40.0000 mg | ORAL_TABLET | Freq: Every day | ORAL | 3 refills | Status: DC
  Filled 2021-08-28: qty 90, 90d supply, fill #0
  Filled 2021-12-18: qty 90, 90d supply, fill #1
  Filled 2022-07-30: qty 90, 90d supply, fill #2

## 2021-08-28 NOTE — Patient Instructions (Signed)
Medication Instructions:  ?Your physician has recommended you make the following change in your medication:  ?1.) start atorvastatin 40 mg - one tablet daily ? ?*If you need a refill on your cardiac medications before your next appointment, please call your pharmacy* ? ? ?Lab Work: ?Please return in 2 months for fasting labs (Lipids/liver function) ? ? ?Testing/Procedures: ?Your physician has requested that you have an echocardiogram. Echocardiography is a painless test that uses sound waves to create images of your heart. It provides your doctor with information about the size and shape of your heart and how well your heart?s chambers and valves are working. This procedure takes approximately one hour. There are no restrictions for this procedure. ? ?Your physician has requested that you have an exercise tolerance test. For further information please visit [email or web address]. Please also follow instruction sheet, as given. ? ? ?Follow-Up: ?At [facility], you and your health needs are our priority.  As part of our continuing mission to provide you with exceptional heart care, we have created designated Provider Care Teams.  These Care Teams include your primary Cardiologist (physician) and Advanced Practice Providers (APPs -  Physician Assistants and Nurse Practitioners) who all work together to provide you with the care you need, when you need it. ? ? ?Your next appointment:   ?12 month(s) ? ?The format for your next appointment:   ?In Person ? ?Provider:   ?Lenna Sciara, MD ? ? ?Other Instructions ?  ?

## 2021-08-28 NOTE — Progress Notes (Signed)
?Cardiology Office Note:   ? ?Date:  08/28/2021  ? ?ID:  Blake Curtis, DOB [birth year], MRN [id number] ? ?PCP:  Burnard Bunting, MD  ? ?[facility] Providers ?Cardiologist:  Lenna Sciara, MD ?Referring MD: Burnard Bunting, MD  ? ?Chief Complaint/Reason for Referral: Chest pain ? ?ASSESSMENT:   ? ?1. Precordial pain   ?2. Type 2 diabetes mellitus without complication, without long-term current use of insulin ([place])   ?3. Hypertension associated with diabetes ([place])   ?4. Hyperlipidemia associated with type 2 diabetes mellitus ([place])   ?5. Aortic atherosclerosis ([place])   ? ? ?PLAN:   ? ?In order of problems listed above: ?1.  We will obtain exercise treadmill stress test and echocardiogram.  We will see patient back in 1 year or earlier if needed. ?2.  Continue aspirin 81 mg, start atorvastatin 40 mg, continue Benicar, and will consider SGLT2 inhibitor in the future. ?3.  Continue Benicar; patient's blood pressure is well controlled. ?4.  Start atorvastatin 40 mg and will check lipid panel and LFTs in 2 months.  Goal LDL is less than 70. ?5.  Aspirin, statin, and strict blood pressure control. ? ?     ? ?Shared Decision Making/Informed Consent ?The risks [chest pain, shortness of breath, cardiac arrhythmias, dizziness, blood pressure fluctuations, myocardial infarction, stroke/transient ischemic attack, and life-threatening complications (estimated to be 1 in 10,000)], benefits (risk stratification, diagnosing coronary artery disease, treatment guidance) and alternatives of an exercise tolerance test were discussed in detail with Blake Curtis and he agrees to proceed.  ? ?Dispo:  Return in about 1 year (around 08/29/2022).  ? ?  ? ?Medication Adjustments/Labs and Tests Ordered: ?Current medicines are reviewed at length with the patient today.  Concerns regarding medicines are outlined above. ? ?The following changes have been made:    ? ?Labs/tests ordered: ?No orders of the defined types were placed in this  encounter. ? ? ?Medication Changes: ?No orders of the defined types were placed in this encounter. ? ? ? ?Current medicines are reviewed at length with the patient today.  The patient does not have concerns regarding medicines. ? ? ?History of Present Illness:   ? ?FOCUSED PROBLEM LIST:   ?1.  Type 2 diabetes not on insulin ?2.  Hypertension ?3.  Hyperlipidemia ?4.  GERD ?5.  Obstructive sleep apnea not on CPAP ?6.  Sarcoidosis ?7.  Aortic atherosclerosis on CT scan 2019 ? ?The patient is a 61 y.o. male with the indicated medical history here for recommendations regarding chest pain.  The patient was seen by his primary care provider recently.  The patient's PCP noted that the patient had fleeting chest pain at rest.  He tells me that he occasionally gets it when he mows his lawn.  This happens maybe once or twice a month.  Sometimes stretching out his chest seems to help.  He denies any significant shortness of breath, bleeding, signs or symptoms stroke, paroxysmal nocturnal dyspnea, orthopnea, presyncope, or syncope.Marland Kitchen  He is required no emergency room visits or hospitalizations.  He is primary care provider recently started him on Benicar for improved blood pressure control.  He tells me his blood sugars are relatively well controlled at home. ? ? ?Current Medications: ?Current Meds  ?Medication Sig  ? aspirin EC 81 MG tablet Take 81 mg by mouth daily. Swallow whole.  ? metFORMIN (GLUCOPHAGE) 500 MG tablet Take 500 mg by mouth daily.  ? metFORMIN (GLUCOPHAGE) 500 MG tablet Take 1 tablet (500 mg total) by  mouth 2 (two) times daily.  ? olmesartan (BENICAR) 20 MG tablet Take 1 tablet (20 mg total) by mouth daily.  ?  ? ?Allergies:    ?Losartan  ? ?Social History:   ?Social History  ? ?Tobacco Use  ? Smoking status: Never  ? Smokeless tobacco: Never  ?Vaping Use  ? Vaping Use: Never used  ?Substance Use Topics  ? Alcohol use: No  ? Drug use: No  ?  ? ?Family Hx: ?Family History  ?Problem Relation Age of Onset  ?  Diabetes Mother   ? Colon cancer Neg Hx   ? Pancreatic cancer Neg Hx   ? Stomach cancer Neg Hx   ?  ? ?Review of Systems:   ?Please see the history of present illness.    ?All other systems reviewed and are negative. ?  ? ? ?EKGs/Labs/Other Test Reviewed:   ? ?EKG:  EKG is not ordered today. ? ?Previous ekg performed October 2021 that I personally reviewed demonstrates sinus bradycardia. ? ?Prior CV studies: ? ?TTE 2018 ?- Left ventricle: The cavity size was normal. Wall thickness was  ?  normal. Systolic function was normal. The estimated ejection  ?  fraction was in the range of 60% to 65%. Wall motion was normal;  ?  there were no regional wall motion abnormalities. Left  ?  ventricular diastolic function parameters were normal.  ?- Mitral valve: Mildly thickened leaflets . There was trivial  ?  regurgitation.  ?- Left atrium: The atrium was normal in size.  ?- Right atrium: The atrium was mildly dilated.  ?- Inferior vena cava: The vessel was normal in size. The  ?  respirophasic diameter changes were in the normal range (>= 50%),  ?  consistent with normal central venous pressure.  ? ?Other studies Reviewed: ?Review of the additional studies/records demonstrates: Chest CT 2019 demonstrates aortic atherosclerosis ? ?Recent Labs: ?No results found for requested labs within last 8760 hours.  ? ?Recent Lipid Panel ?No results found for: CHOL, TRIG, HDL, LDLCALC, LDLDIRECT ? ?Risk Assessment/Calculations:   ? ?  ?    ? ?Physical Exam:   ? ?VS:  BP 110/70   Pulse 69   Ht '6\' 2"'$  (1.88 m)   Wt 256 lb (116.1 kg)   SpO2 99%   BMI 32.87 kg/m?    ?Wt Readings from Last 3 Encounters:  ?08/28/21 256 lb (116.1 kg)  ?03/19/20 260 lb (117.9 kg)  ?03/14/20 260 lb (117.9 kg)  ?  ?GENERAL:  No apparent distress, AOx3 ?HEENT:  No carotid bruits, +2 carotid impulses, no scleral icterus ?CAR: RRR no murmurs, gallops, rubs, or thrills ?RES:  Clear to auscultation bilaterally ?ABD:  Soft, nontender, nondistended, positive bowel  sounds x 4 ?VASC:  +2 radial pulses, +2 carotid pulses, palpable pedal pulses ?NEURO:  CN 2-12 grossly intact; motor and sensory grossly intact ?PSYCH:  No active depression or anxiety ?EXT:  No edema, ecchymosis, or cyanosis ? ?Signed, ?Early Osmond, MD  ?08/28/2021 12:09 PM    ?[facility] ?[address], [place], [place]  [address] ?Phone: [phone]; Fax: [phone]  ? ?Note:  This document was prepared using Dragon voice recognition software and may include unintentional dictation errors. ?

## 2021-08-29 ENCOUNTER — Other Ambulatory Visit (HOSPITAL_COMMUNITY): Payer: Self-pay

## 2021-09-03 ENCOUNTER — Other Ambulatory Visit (HOSPITAL_COMMUNITY): Payer: Self-pay

## 2021-09-19 ENCOUNTER — Ambulatory Visit (INDEPENDENT_AMBULATORY_CARE_PROVIDER_SITE_OTHER): Payer: No Typology Code available for payment source

## 2021-09-19 ENCOUNTER — Ambulatory Visit (HOSPITAL_COMMUNITY): Payer: No Typology Code available for payment source | Attending: Cardiology

## 2021-09-19 DIAGNOSIS — I152 Hypertension secondary to endocrine disorders: Secondary | ICD-10-CM

## 2021-09-19 DIAGNOSIS — I700 Atherosclerosis of aorta: Secondary | ICD-10-CM | POA: Diagnosis present

## 2021-09-19 DIAGNOSIS — E1169 Type 2 diabetes mellitus with other specified complication: Secondary | ICD-10-CM | POA: Diagnosis not present

## 2021-09-19 DIAGNOSIS — E785 Hyperlipidemia, unspecified: Secondary | ICD-10-CM | POA: Diagnosis present

## 2021-09-19 DIAGNOSIS — R072 Precordial pain: Secondary | ICD-10-CM | POA: Diagnosis not present

## 2021-09-19 DIAGNOSIS — R079 Chest pain, unspecified: Secondary | ICD-10-CM

## 2021-09-19 DIAGNOSIS — E1159 Type 2 diabetes mellitus with other circulatory complications: Secondary | ICD-10-CM | POA: Diagnosis not present

## 2021-09-19 DIAGNOSIS — E119 Type 2 diabetes mellitus without complications: Secondary | ICD-10-CM | POA: Diagnosis not present

## 2021-09-19 LAB — EXERCISE TOLERANCE TEST
Angina Index: 0
Duke Treadmill Score: 7
Estimated workload: 7.9
Exercise duration (min): 6 min
Exercise duration (sec): 38 s
MPHR: 161 {beats}/min
Peak HR: 137 {beats}/min
Percent HR: 85 %
RPE: 17
Rest HR: 58 {beats}/min
ST Depression (mm): 0 mm

## 2021-09-19 LAB — ECHOCARDIOGRAM COMPLETE
Area-P 1/2: 3.12 cm2
S' Lateral: 2.7 cm

## 2021-10-28 ENCOUNTER — Other Ambulatory Visit: Payer: No Typology Code available for payment source

## 2021-10-30 ENCOUNTER — Other Ambulatory Visit: Payer: No Typology Code available for payment source

## 2021-10-30 DIAGNOSIS — I700 Atherosclerosis of aorta: Secondary | ICD-10-CM

## 2021-10-30 DIAGNOSIS — E119 Type 2 diabetes mellitus without complications: Secondary | ICD-10-CM

## 2021-10-30 DIAGNOSIS — R072 Precordial pain: Secondary | ICD-10-CM

## 2021-10-30 DIAGNOSIS — E1169 Type 2 diabetes mellitus with other specified complication: Secondary | ICD-10-CM

## 2021-10-30 DIAGNOSIS — E1159 Type 2 diabetes mellitus with other circulatory complications: Secondary | ICD-10-CM

## 2021-10-30 LAB — LIPID PANEL
Chol/HDL Ratio: 1.8 ratio (ref 0.0–5.0)
Cholesterol, Total: 140 mg/dL (ref 100–199)
HDL: 80 mg/dL (ref >39)
LDL Chol Calc (NIH): 48 mg/dL (ref 0–99)
Triglycerides: 58 mg/dL (ref 0–149)
VLDL Cholesterol Cal: 12 mg/dL (ref 5–40)

## 2021-10-30 LAB — HEPATIC FUNCTION PANEL
ALT: 19 IU/L (ref 0–44)
AST: 23 IU/L (ref 0–40)
Albumin: 4.3 g/dL (ref 3.8–4.9)
Alkaline Phosphatase: 78 IU/L (ref 44–121)
Bilirubin Total: 0.5 mg/dL (ref 0.0–1.2)
Bilirubin, Direct: 0.17 mg/dL (ref 0.00–0.40)
Total Protein: 6.6 g/dL (ref 6.0–8.5)

## 2021-11-20 ENCOUNTER — Other Ambulatory Visit (HOSPITAL_COMMUNITY): Payer: Self-pay

## 2021-12-09 ENCOUNTER — Other Ambulatory Visit (HOSPITAL_COMMUNITY): Payer: Self-pay

## 2021-12-09 MED ORDER — SILDENAFIL CITRATE 20 MG PO TABS
40.0000 mg | ORAL_TABLET | Freq: Every day | ORAL | 11 refills | Status: DC | PRN
  Filled 2021-12-09 – 2021-12-18 (×2): qty 30, 6d supply, fill #0

## 2021-12-18 ENCOUNTER — Other Ambulatory Visit (HOSPITAL_COMMUNITY): Payer: Self-pay

## 2021-12-18 MED ORDER — METFORMIN HCL 500 MG PO TABS
500.0000 mg | ORAL_TABLET | Freq: Every day | ORAL | 3 refills | Status: DC
  Filled 2021-12-18: qty 90, 90d supply, fill #0
  Filled 2022-07-30: qty 90, 90d supply, fill #1

## 2022-01-05 ENCOUNTER — Emergency Department (HOSPITAL_COMMUNITY): Payer: No Typology Code available for payment source

## 2022-01-05 ENCOUNTER — Other Ambulatory Visit: Payer: Self-pay

## 2022-01-05 ENCOUNTER — Emergency Department (HOSPITAL_COMMUNITY)
Admission: EM | Admit: 2022-01-05 | Discharge: 2022-01-05 | Disposition: A | Discharge status: Home / Self Care | Payer: No Typology Code available for payment source | Attending: Emergency Medicine | Admitting: Emergency Medicine

## 2022-01-05 ENCOUNTER — Encounter (HOSPITAL_COMMUNITY): Payer: Self-pay

## 2022-01-05 DIAGNOSIS — Z79899 Other long term (current) drug therapy: Secondary | ICD-10-CM | POA: Insufficient documentation

## 2022-01-05 DIAGNOSIS — R42 Dizziness and giddiness: Secondary | ICD-10-CM | POA: Diagnosis not present

## 2022-01-05 DIAGNOSIS — Z7982 Long term (current) use of aspirin: Secondary | ICD-10-CM | POA: Diagnosis not present

## 2022-01-05 DIAGNOSIS — Z7984 Long term (current) use of oral hypoglycemic drugs: Secondary | ICD-10-CM | POA: Insufficient documentation

## 2022-01-05 DIAGNOSIS — E119 Type 2 diabetes mellitus without complications: Secondary | ICD-10-CM | POA: Diagnosis not present

## 2022-01-05 LAB — BASIC METABOLIC PANEL
Anion gap: 12 (ref 5–15)
BUN: 17 mg/dL (ref 6–20)
CO2: 24 mmol/L (ref 22–32)
Calcium: 9.2 mg/dL (ref 8.9–10.3)
Chloride: 103 mmol/L (ref 98–111)
Creatinine, Ser: 1.09 mg/dL (ref 0.61–1.24)
GFR, Estimated: >60 mL/min (ref >60)
Glucose, Bld: 168 mg/dL — ABNORMAL HIGH (ref 70–99)
Potassium: 3.3 mmol/L — ABNORMAL LOW (ref 3.5–5.1)
Sodium: 139 mmol/L (ref 135–145)

## 2022-01-05 LAB — CBC WITH DIFFERENTIAL/PLATELET
Abs Immature Granulocytes: 0.06 10*3/uL (ref 0.00–0.07)
Basophils Absolute: 0 10*3/uL (ref 0.0–0.1)
Basophils Relative: 0 %
Eosinophils Absolute: 0.1 10*3/uL (ref 0.0–0.5)
Eosinophils Relative: 2 %
HCT: 42.4 % (ref 39.0–52.0)
Hemoglobin: 14.2 g/dL (ref 13.0–17.0)
Immature Granulocytes: 1 %
Lymphocytes Relative: 32 %
Lymphs Abs: 2.4 10*3/uL (ref 0.7–4.0)
MCH: 30.9 pg (ref 26.0–34.0)
MCHC: 33.5 g/dL (ref 30.0–36.0)
MCV: 92.4 fL (ref 80.0–100.0)
Monocytes Absolute: 0.6 10*3/uL (ref 0.1–1.0)
Monocytes Relative: 8 %
Neutro Abs: 4.3 10*3/uL (ref 1.7–7.7)
Neutrophils Relative %: 57 %
Platelets: 186 10*3/uL (ref 150–400)
RBC: 4.59 MIL/uL (ref 4.22–5.81)
RDW: 12.1 % (ref 11.5–15.5)
WBC: 7.6 10*3/uL (ref 4.0–10.5)
nRBC: 0 % (ref 0.0–0.2)

## 2022-01-05 LAB — TROPONIN I (HIGH SENSITIVITY)
Troponin I (High Sensitivity): 3 ng/L (ref <18)
Troponin I (High Sensitivity): <2 ng/L (ref <18)

## 2022-01-05 LAB — CBG MONITORING, ED: Glucose-Capillary: 171 mg/dL — ABNORMAL HIGH (ref 70–99)

## 2022-01-05 MED ORDER — ONDANSETRON HCL 4 MG/2ML IJ SOLN
4.0000 mg | Freq: Once | INTRAMUSCULAR | Status: AC
  Administered 2022-01-05: 4 mg via INTRAVENOUS
  Filled 2022-01-05: qty 2

## 2022-01-05 MED ORDER — MECLIZINE HCL 25 MG PO TABS
25.0000 mg | ORAL_TABLET | Freq: Once | ORAL | Status: AC
  Administered 2022-01-05: 25 mg via ORAL
  Filled 2022-01-05: qty 1

## 2022-01-05 MED ORDER — ONDANSETRON 4 MG PO TBDP
4.0000 mg | ORAL_TABLET | Freq: Once | ORAL | Status: AC
Start: 2022-01-05 — End: 2022-01-05

## 2022-01-05 MED ORDER — ONDANSETRON 4 MG PO TBDP
ORAL_TABLET | ORAL | Status: AC
  Administered 2022-01-05: 4 mg via ORAL
  Filled 2022-01-05: qty 1

## 2022-01-05 MED ORDER — IOHEXOL 350 MG/ML SOLN
80.0000 mL | Freq: Once | INTRAVENOUS | Status: AC | PRN
  Administered 2022-01-05: 75 mL via INTRAVENOUS

## 2022-01-05 MED ORDER — MECLIZINE HCL 25 MG PO TABS
25.0000 mg | ORAL_TABLET | Freq: Three times a day (TID) | ORAL | 0 refills | Status: DC | PRN
  Filled 2022-01-05: qty 30, 10d supply, fill #0

## 2022-01-05 MED ORDER — MECLIZINE HCL 25 MG PO TABS: 25.0000 mg | ORAL_TABLET | Freq: Three times a day (TID) | ORAL | 0 refills | Status: DC | PRN

## 2022-01-05 MED ORDER — LORAZEPAM 2 MG/ML IJ SOLN
0.5000 mg | Freq: Once | INTRAMUSCULAR | Status: AC
  Administered 2022-01-05: 0.5 mg via INTRAVENOUS
  Filled 2022-01-05: qty 1

## 2022-01-05 MED ORDER — SODIUM CHLORIDE 0.9 % IV BOLUS
1000.0000 mL | Freq: Once | INTRAVENOUS | Status: AC
  Administered 2022-01-05: 1000 mL via INTRAVENOUS

## 2022-01-05 NOTE — ED Notes (Signed)
Pt actively vomiting.  MD Countryman into room to re-evaluate pt and talk w/ pt's wife.  Pt given multiple emesis bags for journey home.

## 2022-01-05 NOTE — ED Notes (Signed)
Patient transported to CT 

## 2022-01-05 NOTE — ED Provider Notes (Signed)
Blake Curtis DEPT Provider Note   CSN: [id number] Arrival date & time: 01/05/22  7829     History  Chief Complaint  Patient presents with   Dizziness    Blake Curtis is a 61 y.o. male.  He has a history of diabetes.  Takes metformin.  He said he is felt a little off for a few weeks with some lightheadedness when he wakes up in the morning but quickly resolves.  Today while at church he was severely lightheaded nausea and vomiting.  Unsteady on his feet.  Feels like he might pass out.  Denies sensation of things moving.  No headache blurry vision double vision numbness or weakness.  No chest or abdominal pain.  No new medications or recent illness.  The history is provided by the patient.  Dizziness Quality:  Imbalance and lightheadedness Severity:  Severe Onset quality:  Sudden Duration:  3 hours Timing:  Constant Progression:  Unchanged Chronicity:  New Relieved by:  Nothing Worsened by:  Nothing Ineffective treatments:  None tried Associated symptoms: nausea and vomiting   Associated symptoms: no chest pain, no diarrhea, no headaches, no hearing loss, no shortness of breath, no syncope, no tinnitus, no vision changes and no weakness   Risk factors: no hx of stroke and no new medications        Home Medications Prior to Admission medications   Medication Sig Start Date End Date Taking? Authorizing Provider  aspirin EC 81 MG tablet Take 81 mg by mouth daily. Swallow whole.    [provider]  atorvastatin (LIPITOR) 40 MG tablet Take 1 tablet (40 mg total) by mouth daily. 08/28/21   Early Osmond, MD  metFORMIN (GLUCOPHAGE) 500 MG tablet Take 500 mg by mouth daily. 01/31/20   [provider]  metFORMIN (GLUCOPHAGE) 500 MG tablet Take 1 tablet (500 mg total) by mouth daily with meal. 12/18/21     olmesartan (BENICAR) 20 MG tablet Take 1 tablet (20 mg total) by mouth daily. 08/26/21     sildenafil (REVATIO) 20 MG tablet Take 2-5  tablets (40-100 mg total) by mouth daily as needed 30-60 minutes prior to planned activity. 12/09/21         Allergies    Losartan    Review of Systems   Review of Systems  Constitutional:  Negative for fever.  HENT:  Negative for hearing loss and tinnitus.   Eyes:  Negative for visual disturbance.  Respiratory:  Negative for shortness of breath.   Cardiovascular:  Negative for chest pain and syncope.  Gastrointestinal:  Positive for nausea and vomiting. Negative for diarrhea.  Musculoskeletal:  Negative for neck pain.  Neurological:  Positive for dizziness. Negative for weakness and headaches.    Physical Exam Updated Vital Signs BP (!) 142/85 (BP Location: Right Arm)   Pulse 78   Temp (!) 97.5 F (36.4 C) (Oral)   Resp 16   Ht '6\' 2"'$  (1.88 m)   Wt 115.2 kg   SpO2 95%   BMI 32.61 kg/m  Physical Exam Vitals and nursing note reviewed.  Constitutional:      General: He is not in acute distress.    Appearance: Normal appearance. He is well-developed.  HENT:     Head: Normocephalic and atraumatic.     Right Ear: Tympanic membrane and ear canal normal.     Left Ear: Tympanic membrane and ear canal normal.  Eyes:     Extraocular Movements: Extraocular movements intact.  Conjunctiva/sclera: Conjunctivae normal.     Pupils: Pupils are equal, round, and reactive to light.     Comments: Does have a few beats of horizontal nystagmus to the left.  Does not reproduce symptoms  Cardiovascular:     Rate and Rhythm: Normal rate and regular rhythm.     Heart sounds: No murmur heard. Pulmonary:     Effort: Pulmonary effort is normal. No respiratory distress.     Breath sounds: Normal breath sounds.  Abdominal:     Palpations: Abdomen is soft.     Tenderness: There is no abdominal tenderness.  Musculoskeletal:        General: Normal range of motion.     Cervical back: Neck supple.     Right lower leg: No edema.     Left lower leg: No edema.  Skin:    General: Skin is warm and  dry.     Capillary Refill: Capillary refill takes less than 2 seconds.  Neurological:     General: No focal deficit present.     Mental Status: He is alert and oriented to person, place, and time.     Cranial Nerves: No cranial nerve deficit.     Sensory: No sensory deficit.     Motor: No weakness.     ED Results / Procedures / Treatments   Labs (all labs ordered are listed, but only abnormal results are displayed) Labs Reviewed  BASIC METABOLIC PANEL - Abnormal; Notable for the following components:      Result Value   Potassium 3.3 (*)    Glucose, Bld 168 (*)    All other components within normal limits  CBG MONITORING, ED - Abnormal; Notable for the following components:   Glucose-Capillary 171 (*)    All other components within normal limits  CBC WITH DIFFERENTIAL/PLATELET  TROPONIN I (HIGH SENSITIVITY)  TROPONIN I (HIGH SENSITIVITY)    EKG EKG Interpretation  Date/Time:  Sunday January 05 2022 10:22:23 EDT Ventricular Rate:  82 PR Interval:  199 QRS Duration: 95 QT Interval:  412 QTC Calculation: 482 R Axis:   79 Text Interpretation: Sinus rhythm Minimal ST elevation, inferior leads Borderline prolonged QT interval No significant change since prior 10/21 Confirmed by Aletta Edouard [phone]) on 01/05/2022 10:37:23 AM  Radiology MR BRAIN WO CONTRAST  Result Date: 01/05/2022 CLINICAL DATA:  Dizziness, persistent/recurrent, cardiac or vascular cause suspected. Nausea and vomiting associated with dizziness for 1 month. Symptoms recently worse today at church. EXAM: MRI HEAD WITHOUT CONTRAST TECHNIQUE: Multiplanar, multiecho pulse sequences of the brain and surrounding structures were obtained without intravenous contrast. COMPARISON:  CTA head and neck 01/05/2022 FINDINGS: Brain: No acute infarct, hemorrhage, or mass lesion is present. Scattered subcortical T2 hyperintensities are mildly advanced for age. Three separate punctate T2 hyperintensities are present in the mid  brain, likely reflecting remote lacunar infarcts. Cerebellum is unremarkable. Vascular: Flow is present in the major intracranial arteries. Skull and upper cervical spine: Sinuses/Orbits: The paranasal sinuses and mastoid air cells are clear. The globes and orbits are within normal limits. IMPRESSION: 1. No acute intracranial abnormality. 2. Scattered subcortical T2 hyperintensities bilaterally are mildly advanced for age. The finding is nonspecific but can be seen in the setting of chronic microvascular ischemia, a demyelinating process such as multiple sclerosis, vasculitis, complicated migraine headaches, or as the sequelae of a prior infectious or inflammatory process. Electronically Signed   By: San Morelle M.D.   On: 01/05/2022 14:16   CT ANGIO HEAD NECK W WO  CM  Result Date: 01/05/2022 CLINICAL DATA:  61 year old male with dizziness, vomiting. Prolonged symptoms. History of sarcoidosis. EXAM: CT ANGIOGRAPHY HEAD AND NECK TECHNIQUE: Multidetector CT imaging of the head and neck was performed using the standard protocol during bolus administration of intravenous contrast. Multiplanar CT image reconstructions and MIPs were obtained to evaluate the vascular anatomy. Carotid stenosis measurements (when applicable) are obtained utilizing NASCET criteria, using the distal internal carotid diameter as the denominator. RADIATION DOSE REDUCTION: This exam was performed according to the departmental dose-optimization program which includes automated exposure control, adjustment of the mA and/or kV according to patient size and/or use of iterative reconstruction technique. CONTRAST:  78m OMNIPAQUE IOHEXOL 350 MG/ML SOLN COMPARISON:  Neck and chest CT 04/14/2019. FINDINGS: CT HEAD Brain: No midline shift, ventriculomegaly, mass effect, evidence of mass lesion, intracranial hemorrhage or evidence of cortically based acute infarction. Gray-white matter differentiation is within normal limits throughout the  brain. Cerebral volume seems to be at the lower limits of normal for age. Calvarium and skull base: Negative. Paranasal sinuses: Tympanic cavities, Visualized paranasal sinuses and mastoids are clear. Orbits: Visualized orbits and scalp soft tissues are within normal limits. CTA NECK Skeleton: Cervical spine degeneration with vacuum disc at both C3-C4 and C4-C5. Small gas containing subchondral cysts at those levels. No acute osseous abnormality identified. Upper chest: Small superior mediastinal lymph nodes appear stable since 2020, compatible with sarcoidosis. Multiple small bilateral upper lobe pulmonary nodules are stable since November 2020 (up to 6 mm individually), consistent with benign sarcoid etiology (no follow-up imaging recommended). Other neck: 13 mm left thyroid hypodense nodule has not significantly changed since 2020. Not clinically significant; no follow-up imaging recommended (ref: J Am Coll Radiol. 2015 Feb;12(2): 143-50).Cervical lymphadenopathy has regressed bilaterally since 2020, was favored to be sarcoid related. No acute neck soft tissue finding. Aortic arch: 3 vessel arch configuration. Minimal arch atherosclerosis. Right carotid system: Negative. Left carotid system: Minimal left CCA and mild left ICA origin atherosclerosis with no stenosis. Mild tortuosity just below the skull base. Vertebral arteries: Proximal right subclavian artery and right vertebral artery origin appear normal. Patent right vertebral artery to the skull base without stenosis. Mild plaque in the proximal left subclavian artery without stenosis. Normal left vertebral artery origin. Codominant left vertebral artery is patent to the skull base without stenosis. CTA HEAD Posterior circulation: Codominant distal vertebral arteries are patent to the vertebrobasilar junction without stenosis. Patent PICA origins. Patent basilar artery without stenosis. Normal SCA and PCA origins. Small posterior communicating arteries.  Bilateral PCA branches are within normal limits. Anterior circulation: Both ICA siphons are patent with mild cavernous segment tortuosity no significant siphon plaque or stenosis. Normal posterior communicating artery origins. Patent carotid termini. Patent MCA and ACA origins. Tortuous A1 segments more so the right. Normal anterior communicating artery. Bilateral ACA branches are within normal limits. Left MCA M1 segment is tortuous and mildly beaded (series 13, image 55) without significant stenosis or discrete aneurysm at this time. Left MCA bifurcation and left MCA branches are within normal limits. Right MCA M1 segment and bifurcation are tortuous without stenosis. Right MCA branches are within normal limits. Venous sinuses: Patent. Anatomic variants: None significant. Review of the MIP images confirms the above findings IMPRESSION: 1. Negative for large vessel occlusion. Negative for age CT appearance of the brain. 2. Minor atherosclerosis in the head and neck. No significant arterial stenosis. But tortuous intracranial anterior circulation with a mildly beaded appearance of the Left MCA M1. Difficult to exclude  Fibromuscular Dysplasia (FMD). 3. Visible upper chest stable since 2020, compatible with Sarcoidosis related lung and lymph node changes. Electronically Signed   By: Genevie Ann M.D.   On: 01/05/2022 13:13    Procedures Procedures    Medications Ordered in ED Medications  sodium chloride 0.9 % bolus 1,000 mL (0 mLs Intravenous Stopped 01/05/22 1131)  ondansetron (ZOFRAN) injection 4 mg (4 mg Intravenous Given 01/05/22 1030)  LORazepam (ATIVAN) injection 0.5 mg (0.5 mg Intravenous Given 01/05/22 1035)  iohexol (OMNIPAQUE) 350 MG/ML injection 80 mL (75 mLs Intravenous Contrast Given 01/05/22 1240)  meclizine (ANTIVERT) tablet 25 mg (25 mg Oral Given 01/05/22 1233)  ondansetron (ZOFRAN-ODT) disintegrating tablet 4 mg (4 mg Oral Given 01/05/22 1556)    ED Course/ Medical Decision Making/  A&P Clinical Course as of 01/05/22 1720  Sun Jan 05, 2022  1129 Patient states his dizziness is about the same although he is less nauseous.  He thinks he will be able to tolerate a CT scan now. [MB]  1829 MRI does not show any acute stroke.  They do comment upon some T2 hyperintensities.  Sounds like this will need an outpatient work-up if he is feeling well enough to go home now. [MB]  9371 Reviewed results with patient and wife.  They are comfortable going home.  We will put referral in for vestibular therapy and neurology outpatient and a prescription for meclizine.  Return instructions discussed [MB]    Clinical Course User Index [MB] Hayden Rasmussen, MD                           Medical Decision Making Amount and/or Complexity of Data Reviewed Labs: ordered. Radiology: ordered.  Risk Prescription drug management.   This patient complains of dizziness lightheadedness unsteady gait nausea and vomiting; this involves an extensive number of treatment Options and is a complaint that carries with it a high risk of complications and morbidity. The differential includes tumor, bleed, stroke, vertigo, ACS, anemia, metabolic derangement  I ordered, reviewed and interpreted labs, which included CBC normal, chemistries with low potassium elevated glucose, troponins flat I ordered medication IV fluids and nausea medication, IV Ativan and oral meclizine with some improvement in his symptoms and reviewed PMP when indicated. I ordered imaging studies which included CT angio head and neck MRI brain  and I independently    visualized and interpreted imaging which showed no acute stroke.  Does have some white matter changes that will need follow-up Additional history obtained from patient's wife Previous records obtained and reviewed in epic no recent admissions  Cardiac monitoring reviewed, normal sinus rhythm Social determinants considered, no significant barriers Critical Interventions:  None  After the interventions stated above, I reevaluated the patient and found patient to be improved and his symptoms Admission and further testing considered, no indications for admission or further testing at this time.  Will give outpatient referral for neurology and vestibular therapy.  Return instructions discussed.         Final Clinical Impression(s) / ED Diagnoses Final diagnoses:  Dizziness    Rx / DC Orders ED Discharge Orders     None         Hayden Rasmussen, MD 01/05/22 1723

## 2022-01-05 NOTE — Discharge Instructions (Addendum)
You are seen in the emergency department for dizziness nausea vomiting.  You had blood work CAT scan and an MRI that did not show an obvious explanation for your symptoms.  We are trialing you with meclizine which may help limit your symptoms.  Please stay well-hydrated and rest.  We have also put a referral in for vestibular therapy and a neurology appointment.  Please return to the hospital if any worsening or concerning symptoms.

## 2022-01-05 NOTE — ED Triage Notes (Signed)
Pt BIB EMS c/o nausea, vomiting and dizziness x 1 month. States symptoms got worse today while in church.  Per EMS  80 HR 98% room air 18 resp 132/90 '4mg'$  Zofran given en route

## 2022-01-05 NOTE — ED Notes (Signed)
Pt states understanding of dc instructions, importance of follow up, work note, and prescription. Pt denies questions or concerns upon dc. Pt declined wheelchair assistance upon dc. Pt ambulated out of ed w/ steady gait. No belongings left in room upon dc.

## 2022-01-05 NOTE — ED Provider Notes (Signed)
I am not the primary provider for this patient.  Patient was listed for discharge and I did not receive signout. I was called to bedside to review discharge instructions with the patient.  Patient is continuing to have feelings of vertigo with positional changes.  I reviewed objective imaging, he has a globally reassuring MRI with areas of T2 hyperintensity that are nonspecific at this time.  Additionally, patient underwent CT angiography with out focal abnormalities appreciated.  He received meclizine and was asymptomatic at rest.  When he went to ambulate, patient had recurrence of his vertigo symptoms. He is currently sitting up in bed and has no focal neurologic deficits. He is able to ambulate with assistance. He received Zofran and symptoms seem to be improving again. Given the paroxysmal and positional nature of his symptoms, reassuring extensive objective evaluation by prior provider, there is no acute indication for patient to be rechecked and an outpatient care plan appears reasonable.  Patient has referrals to neurology.  I reinforced that if patient has intractable symptoms, symptoms at rest or failure of vertigo therapies, patient should return for further care and management.   Tretha Sciara, MD 01/05/22 1615

## 2022-01-06 ENCOUNTER — Other Ambulatory Visit (HOSPITAL_COMMUNITY): Payer: Self-pay

## 2022-01-07 ENCOUNTER — Other Ambulatory Visit (HOSPITAL_COMMUNITY): Payer: Self-pay

## 2022-01-09 ENCOUNTER — Encounter: Payer: Self-pay | Admitting: Neurology

## 2022-01-09 ENCOUNTER — Other Ambulatory Visit (HOSPITAL_BASED_OUTPATIENT_CLINIC_OR_DEPARTMENT_OTHER): Payer: Self-pay

## 2022-01-09 ENCOUNTER — Ambulatory Visit (INDEPENDENT_AMBULATORY_CARE_PROVIDER_SITE_OTHER): Payer: No Typology Code available for payment source | Admitting: Neurology

## 2022-01-09 VITALS — BP 117/86 | HR 82 | Ht 74.0 in | Wt 243.0 lb

## 2022-01-09 DIAGNOSIS — R2689 Other abnormalities of gait and mobility: Secondary | ICD-10-CM | POA: Diagnosis not present

## 2022-01-09 DIAGNOSIS — R2681 Unsteadiness on feet: Secondary | ICD-10-CM

## 2022-01-09 MED ORDER — DIAZEPAM 2 MG PO TABS
2.0000 mg | ORAL_TABLET | Freq: Four times a day (QID) | ORAL | 0 refills | Status: DC | PRN
  Filled 2022-01-09: qty 7, 2d supply, fill #0

## 2022-01-09 NOTE — Patient Instructions (Signed)
Continue current medications  Continue with Meclizine 25 mg three time daily  Trial of Valium for the ongoing dizziness  Follow with primary care physician and consider referral to ENT  Follow up with vestibular PT as scheduled  Return if worse

## 2022-01-09 NOTE — Progress Notes (Signed)
[facility]  PATIENT: Blake Curtis DOB: 12-23-1960  REQUESTING CLINICIAN: Hayden Rasmussen, MD HISTORY FROM: Patient and spouse  REASON FOR VISIT: New onset dizziness    HISTORICAL  CHIEF COMPLAINT:  Chief Complaint  Patient presents with   New Patient (Initial Visit)    Room 15 w/ wife, Velva Harman. Hospital follow up. Sudden onset of dizziness 01/05/22. Meclizine is not helpful. Bilateral ears feel stopped up, like they need to pop. Symptoms are constant. He underwent CTA and MRI in ED. They would like to review the results.     HISTORY OF PRESENT ILLNESS:  This is a 61 year old gentleman past medical history of diabetes, hypertension, hyperlipidemia who is presenting with persistent dizziness since Sunday.  Patient reports that he was at church on Sunday and had a sudden onset of dizziness that he describes as head spinning inside.  He reported initial symptom was so severe that he had nausea and vomiting.  He went to the ED had a full work-up including MRI brain which was negative for any acute stroke.  Patient was discharged home with meclizine but stated that his symptoms are still present.  He denies any room spinning sensation but feels like his head is spinning inside.  His gait is abnormal, reported he is leaning to the left when walking denies any falls.  Never had this episode in the past.     OTHER MEDICAL CONDITIONS: Hypertension, Hyperlipidemia and DMII   REVIEW OF SYSTEMS: Full 14 system review of systems performed and negative with exception of: As noted in the HPI   ALLERGIES: Allergies  Allergen Reactions   Losartan Cough    HOME MEDICATIONS: Outpatient Medications Prior to Visit  Medication Sig Dispense Refill   aspirin EC 81 MG tablet Take 81 mg by mouth daily. Swallow whole.     atorvastatin (LIPITOR) 40 MG tablet Take 1 tablet (40 mg total) by mouth daily. 90 tablet 3   meclizine (ANTIVERT) 25 MG tablet Take 1 tablet (25 mg total) by mouth  3 (three) times daily as needed for dizziness. 30 tablet 0   metFORMIN (GLUCOPHAGE) 500 MG tablet Take 1 tablet (500 mg total) by mouth daily with meal. 90 tablet 3   olmesartan (BENICAR) 20 MG tablet Take 1 tablet (20 mg total) by mouth daily. 90 tablet 3   sildenafil (REVATIO) 20 MG tablet Take 2-5 tablets (40-100 mg total) by mouth daily as needed 30-60 minutes prior to planned activity. 30 tablet 11   metFORMIN (GLUCOPHAGE) 500 MG tablet Take 500 mg by mouth daily.     No facility-administered medications prior to visit.    PAST MEDICAL HISTORY: Past Medical History:  Diagnosis Date   Diabetes mellitus without complication ([place])    diet controlled   Hypertension    Sleep apnea    No CPAP    PAST SURGICAL HISTORY: Past Surgical History:  Procedure Laterality Date   APPENDECTOMY  2002   TRANSURETHRAL RESECTION OF PROSTATE N/A 03/19/2020   Procedure: TRANSURETHRAL RESECTION OF THE PROSTATE (TURP) WITH CYSTOSCOPY;  Surgeon: Raynelle Bring, MD;  Location: [facility];  Service: Urology;  Laterality: N/A;  ANESTHESIA GENERAL OR SPINAL PER DR Alinda Money    FAMILY HISTORY: Family History  Problem Relation Age of Onset   Diabetes Mother    Colon cancer Neg Hx    Pancreatic cancer Neg Hx    Stomach cancer Neg Hx     SOCIAL HISTORY: Social History   Socioeconomic History   Marital  status: Married    Spouse name: Not on file   Number of children: 0   Years of education: some college   Highest education level: Not on file  Occupational History   Occupation: Building control surveyor  Tobacco Use   Smoking status: Never   Smokeless tobacco: Never  Vaping Use   Vaping Use: Never used  Substance and Sexual Activity   Alcohol use: No   Drug use: No   Sexual activity: Not on file  Other Topics Concern   Not on file  Social History Narrative   Lives at home with his wife.   Right-handed.   No daily caffeine use.    Social Determinants of Health   Financial Resource Strain: Not on  file  Food Insecurity: Not on file  Transportation Needs: Not on file  Physical Activity: Not on file  Stress: Not on file  Social Connections: Not on file  Intimate Partner Violence: Not on file    PHYSICAL EXAM  GENERAL EXAM/CONSTITUTIONAL: Vitals:  Vitals:   01/09/22 1349 01/09/22 1446  BP: 108/70 117/86  Pulse: 65 82  Weight: 243 lb (110.2 kg)   Height: '6\' 2"'$  (1.88 m)    Body mass index is 31.2 kg/m. Wt Readings from Last 3 Encounters:  01/09/22 243 lb (110.2 kg)  01/05/22 254 lb (115.2 kg)  08/28/21 256 lb (116.1 kg)   Patient is in no distress; well developed, nourished and groomed; neck is supple. Ear canals are clear of wax.    EYES: Pupils round and reactive to light, Visual fields full to confrontation, Extraocular movements intacts,   MUSCULOSKELETAL: Gait, strength, tone, movements noted in Neurologic exam below  NEUROLOGIC: MENTAL STATUS:      No data to display         awake, alert, oriented to person, place and time recent and remote memory intact normal attention and concentration language fluent, comprehension intact, naming intact fund of knowledge appropriate  CRANIAL NERVE:  2nd, 3rd, 4th, 6th - pupils equal and reactive to light, visual fields full to confrontation, extraocular muscles intact, no nystagmus 5th - facial sensation symmetric 7th - facial strength symmetric 8th - hearing intact 9th - palate elevates symmetrically, uvula midline 11th - shoulder shrug symmetric 12th - tongue protrusion midline  MOTOR:  normal bulk and tone, full strength in the BUE, BLE  SENSORY:  normal and symmetric to light touch  COORDINATION:  finger-nose-finger, fine finger movements normal   GAIT/STATION:  Leans toward the left with ambulation.    DIAGNOSTIC DATA (LABS, IMAGING, TESTING) - I reviewed patient records, labs, notes, testing and imaging myself where available.  Lab Results  Component Value Date   WBC 7.6 01/05/2022   HGB  14.2 01/05/2022   HCT 42.4 01/05/2022   MCV 92.4 01/05/2022   PLT 186 01/05/2022      Component Value Date/Time   NA 139 01/05/2022 1015   K 3.3 (L) 01/05/2022 1015   CL 103 01/05/2022 1015   CO2 24 01/05/2022 1015   GLUCOSE 168 (H) 01/05/2022 1015   BUN 17 01/05/2022 1015   CREATININE 1.09 01/05/2022 1015   CALCIUM 9.2 01/05/2022 1015   PROT 6.6 10/30/2021 0753   ALBUMIN 4.3 10/30/2021 0753   AST 23 10/30/2021 0753   ALT 19 10/30/2021 0753   ALKPHOS 78 10/30/2021 0753   BILITOT 0.5 10/30/2021 0753   GFRNONAA >60 01/05/2022 1015   Lab Results  Component Value Date   CHOL 140 10/30/2021  HDL 80 10/30/2021   LDLCALC 48 10/30/2021   TRIG 58 10/30/2021   CHOLHDL 1.8 10/30/2021   Lab Results  Component Value Date   HGBA1C 6.1 (H) 03/14/2020   No results found for: "VITAMINB12" No results found for: "TSH"  MRI Brain 01/05/22 1. No acute intracranial abnormality. 2. Scattered subcortical T2 hyperintensities bilaterally are mildly advanced for age. The finding is nonspecific but can be seen in the setting of chronic microvascular ischemia, a demyelinating process such as multiple sclerosis, vasculitis, complicated migraine headaches, or as the sequelae of a prior infectious or inflammatory process.  CTA Head and Neck 01/05/22  1. Negative for large vessel occlusion. Negative for age CT appearance of the brain. 2. Minor atherosclerosis in the head and neck. No significant arterial stenosis. But tortuous intracranial anterior circulation with a mildly beaded appearance of the Left MCA M1. Difficult to exclude Fibromuscular Dysplasia (FMD). 3. Visible upper chest stable since 2020, compatible with Sarcoidosis related lung and lymph node changes.  ASSESSMENT AND PLAN  61 y.o. year old male with past medical history of hypertension, hyperlipidemia and diabetes is presenting with onset of dizziness for the past 5 days.  Dizziness described as "feeling of head spinning inside".  He  had tried meclizine without clear benefit.  Patient continued to lean on the left when walking.  At this time I am going to give him a trial of Valium for ongoing dizziness.  He did have a referral to vestibular therapy therefore I advised him to call and schedule an appointment.  If the symptoms are still ongoing, I advised him to follow-up with PCP for referral to ENT.  They are comfortable with plan.  Return as needed or any other concerns    1. Balance problem   2. Unsteadiness      Patient Instructions  Continue current medications  Continue with Meclizine 25 mg three time daily  Trial of Valium for the ongoing dizziness  Follow with primary care physician and consider referral to ENT  Follow up with vestibular PT as scheduled  Return if worse   No orders of the defined types were placed in this encounter.   Meds ordered this encounter  Medications   diazepam (VALIUM) 2 MG tablet    Sig: Take 1 tablet (2 mg total) by mouth every 6 (six) hours as needed for anxiety.    Dispense:  7 tablet    Refill:  0    Return if symptoms worsen or fail to improve.  I have spent a total of 50 minutes dedicated to this patient today, preparing to see patient, performing a medically appropriate examination and evaluation, ordering tests and/or medications and procedures, and counseling and educating the patient/family/caregiver; independently interpreting result and communicating results to the family/patient/caregiver; and documenting clinical information in the electronic medical record.   Alric Ran, MD 01/09/2022, 6:26 PM  [facility] [address], [place], [place] [address] [phone]

## 2022-01-10 ENCOUNTER — Ambulatory Visit: Payer: No Typology Code available for payment source | Admitting: Neurology

## 2022-01-10 ENCOUNTER — Other Ambulatory Visit (HOSPITAL_BASED_OUTPATIENT_CLINIC_OR_DEPARTMENT_OTHER): Payer: Self-pay

## 2022-01-10 MED ORDER — FAMOTIDINE 20 MG PO TABS
ORAL_TABLET | ORAL | 1 refills | Status: DC
  Filled 2022-01-10: qty 60, 30d supply, fill #0

## 2022-01-13 NOTE — Therapy (Signed)
OUTPATIENT PHYSICAL THERAPY VESTIBULAR EVALUATION     Patient Name: Blake Curtis MRN: [id number] DOB:11/24/1960, 61 y.o., male Today's Date: 01/13/2022  PCP: Burnard Bunting, MD REFERRING PROVIDER: Reginold Agent, NP     Past Medical History:  Diagnosis Date   Diabetes mellitus without complication ([place])    diet controlled   Hypertension    Sleep apnea    No CPAP   Past Surgical History:  Procedure Laterality Date   APPENDECTOMY  2002   TRANSURETHRAL RESECTION OF PROSTATE N/A 03/19/2020   Procedure: TRANSURETHRAL RESECTION OF THE PROSTATE (TURP) WITH CYSTOSCOPY;  Surgeon: Raynelle Bring, MD;  Location: [facility];  Service: Urology;  Laterality: N/A;  ANESTHESIA GENERAL OR SPINAL PER DR [facility]   Patient Active Problem List   Diagnosis Date Noted   Bladder outlet obstruction 03/19/2020   Sarcoidosis 03/30/2019   Dysphagia 03/30/2019   Prostate nodule 04/29/2013   Benign neoplasm of colon 04/29/2013    ONSET DATE: 01/05/22  REFERRING DIAG: H81.10 (ICD-10-CM) - BPPV (benign paroxysmal positional vertigo)  THERAPY DIAG:  No diagnosis found.  Rationale for Evaluation and Treatment Rehabilitation  SUBJECTIVE:   SUBJECTIVE STATEMENT: Patient reports that dizziness started 2 Sundays ago. First began when he was video taping at church. Had some nausea and vomiting and was seen in the ED. Dizziness has improved a little. Worse when shaking head, walking, getting out of bed. Reports that he tried to do a maneuver from Youtube where he lays down with his head off the edge of the bed. Helps temporarily but then comes back. Episodes last seconds and are described as lightheaded. Denies head trauma, infection/illness, allergies, vision changes/double vision, tinnitus, otalgia, photo/phonophobia, difficulty with reading, UE/LE weakness. Reports B hearing sounds muffled and somewhat improved with pressure over the back of the ear, N/T in bottom of feet for months-1 year, and new  imbalance.   Pt accompanied by: self  PERTINENT HISTORY: diabetes, hypertension, hyperlipidemia   PAIN:  Are you having pain? No  PRECAUTIONS: Fall  WEIGHT BEARING RESTRICTIONS No  FALLS: Has patient fallen in last 6 months? 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 2. Scattered subcortical T2 hyperintensities bilaterally are mildly advanced for age. The finding is nonspecific but can be seen in the setting of chronic microvascular ischemia, a demyelinating process such as multiple sclerosis, vasculitis, complicated migraine headaches, or as the sequelae of a prior infectious or inflammatory process.  COGNITION: Overall cognitive status: Within functional limits for tasks assessed   SENSATION: Reports tingling in B starting months-1 year ago    POSTURE: rounded shoulders and forward head   GAIT: Gait pattern: mild imbalance with gait Assistive device utilized: None Level of assistance: Modified independence   PATIENT SURVEYS:  FOTO [phone]   VESTIBULAR ASSESSMENT   GENERAL OBSERVATION: patient is wearing glasses for distance which he reports he wears all the time      OCULOMOTOR EXAM:   Ocular Alignment: L eye elevated in orbit    Ocular ROM: with superior gaze, R eye unable to elevate    Spontaneous Nystagmus: absent   Gaze-Induced Nystagmus: absent   Smooth Pursuits: intact; pt  reports diplopia with superior gaze    Saccades: slow in inferior/superior direction   Convergence/Divergence: 4 cm; c/o diplopia upon divergence of eyes     VESTIBULAR - OCULAR  REFLEX:    Slow VOR: Normal; c/o dizziness in vertical and horizontal directions    VOR Cancellation: Normal; c/o dizziness    Head-Impulse Test: HIT Right: positive HIT Left: negative      POSITIONAL TESTING:  Right Roll Test: negative; c/o slight dizziness during movement Left Roll Test: negative; c/o slight dizziness during movement Right Dix-Hallpike: upbeating nystagmus lasting ~5 sec, followed by L beating horizontal nystagmus lasting 30 sec Left Dix-Hallpike: negative     VESTIBULAR TREATMENT:  Access Code: [email or web address] URL: [email or web address] Date: 01/14/2022 Prepared by: [facility]  Exercises - Seated Gaze Stabilization with Head Rotation  - 1 x daily - 5 x weekly - 2 sets - 30 sec hold - Seated Gaze Stabilization with Head Nod  - 1 x daily - 5 x weekly - 2 sets - 30 sec hold - Seated Horizontal Smooth Pursuit  - 1 x daily - 5 x weekly - 2 sets - 30 sec hold  PATIENT EDUCATION: Education details: prognosis, POC, HEP; advised patient to f/u with opthalmology d/t new onset of diplopia Person educated: Patient Education method: Explanation, Demonstration, Tactile cues, Verbal cues, and Handouts Education comprehension: verbalized understanding and returned demonstration   GOALS: Goals reviewed with patient? Yes  SHORT TERM GOALS: Target date: 02/04/2022  Patient to be independent with initial HEP. Baseline: HEP initiated Goal status: INITIAL    LONG TERM GOALS: Target date: 02/25/2022  Patient to be independent with advanced HEP. Baseline: Not yet initiated  Goal status: INITIAL  Patient to report 0/10 dizziness with standing vertical and horizontal VOR for 30 seconds. Baseline: Unable Goal status: INITIAL  Patient will report 0/10 dizziness with bed mobility.  Baseline: Symptomatic  Goal status: INITIAL  Patient to demonstrate mild-moderate sway with M-CTSIB condition with eyes closed/foam surface in  order to improve safety in environments with uneven surfaces and dim lighting. Baseline: NT Goal status: INITIAL  Patient to score at least 20/24 on DGI in order to decrease risk of falls. Baseline: NT Goal status: INITIAL  Patient will ambulate over outdoor surfaces with LRAD while performing head turns to scan environment with good stability in order to indicate safe community mobility. Baseline: Unable Goal status: INITIAL    ASSESSMENT:  CLINICAL IMPRESSION:   Patient is a 61 y/o M presenting to OPPT with c/o acute dizziness for the past week. Episodes last seconds and are described as lightheaded. Worse when shaking head, walking, getting out of bed. Reports that he tried to self-treat with a maneuver from Youtube. Denies head trauma, infection/illness, allergies, vision changes/double vision, tinnitus, otalgia, photo/phonophobia, difficulty with reading, UE/LE weakness. Reports B hearing sounds muffled and somewhat improved with pressure over the back of the ear, N/T in bottom of feet for months-1 year, and new imbalance. Oculomotor exam revealed L eye elevated in orbit, inability of R eye to elevate with superior gaze, diplopia with superior smooth pursuit, slow vertical saccades, dizziness with vertical and horizontal VOR, positive R HIT. Positional testing revealed dizziness and nonspecific nystagmus with R DH; plan to reassess next session. Patient was educated on VOR HEP and reported understanding. Would benefit from skilled PT services 2x/week for 6 weeks to address aforementioned impairments in order to optimize level of function.     OBJECTIVE IMPAIRMENTS Abnormal  gait, decreased balance, and dizziness.   ACTIVITY LIMITATIONS carrying, lifting, bending, standing, squatting, sleeping, stairs, transfers, bed mobility, toileting, dressing, and reach over head  PARTICIPATION LIMITATIONS: meal prep, cleaning, laundry, driving, shopping, community activity, occupation, yard work, and  church  PERSONAL FACTORS Age, Past/current experiences, Profession, Time since onset of injury/illness/exacerbation, and 3+ comorbidities: diabetes, hypertension, hyperlipidemia are also affecting patient's functional outcome.   REHAB POTENTIAL: Good  CLINICAL DECISION MAKING: Evolving/moderate complexity  EVALUATION COMPLEXITY: Moderate   PLAN: PT FREQUENCY: 1-2x/week  PT DURATION: 6 weeks  PLANNED INTERVENTIONS: Therapeutic exercises, Therapeutic activity, Neuromuscular re-education, Balance training, Gait training, Patient/Family education, Self Care, Joint mobilization, Stair training, Vestibular training, Canalith repositioning, DME instructions, Dry Needling, Electrical stimulation, Cryotherapy, Moist heat, Taping, Manual therapy, and Re-evaluation  PLAN FOR NEXT SESSION: reassess R DH, DGI, M-CTSIB, progress VOR training and habituation    Janene Harvey, PT, DPT 01/14/22 9:54 AM  [facility] at [facility] [address] [place], [place] [address] Phone # [phone] Fax # [phone]

## 2022-01-14 ENCOUNTER — Other Ambulatory Visit: Payer: Self-pay

## 2022-01-14 ENCOUNTER — Encounter: Payer: Self-pay | Admitting: Physical Therapy

## 2022-01-14 ENCOUNTER — Other Ambulatory Visit: Payer: Self-pay | Admitting: Neurology

## 2022-01-14 ENCOUNTER — Other Ambulatory Visit (HOSPITAL_BASED_OUTPATIENT_CLINIC_OR_DEPARTMENT_OTHER): Payer: Self-pay

## 2022-01-14 ENCOUNTER — Ambulatory Visit: Payer: No Typology Code available for payment source | Attending: Emergency Medicine | Admitting: Physical Therapy

## 2022-01-14 ENCOUNTER — Telehealth: Payer: Self-pay | Admitting: Physical Therapy

## 2022-01-14 DIAGNOSIS — R2681 Unsteadiness on feet: Secondary | ICD-10-CM

## 2022-01-14 DIAGNOSIS — H532 Diplopia: Secondary | ICD-10-CM

## 2022-01-14 DIAGNOSIS — R2689 Other abnormalities of gait and mobility: Secondary | ICD-10-CM | POA: Diagnosis present

## 2022-01-14 DIAGNOSIS — R42 Dizziness and giddiness: Secondary | ICD-10-CM

## 2022-01-14 MED ORDER — PREDNISONE 20 MG PO TABS
ORAL_TABLET | ORAL | 0 refills | Status: DC
  Filled 2022-01-14: qty 5, 5d supply, fill #0

## 2022-01-14 NOTE — Telephone Encounter (Signed)
Hi Dr. April Manson,  Blake Curtis was evaluated today in OPPT for dizziness. Oculomotor exam revealed some red flag symptoms including L eye elevated in orbit, inability of R eye to elevate with superior gaze, diplopia with superior smooth pursuit, slow vertical saccades. Also presented with non-specific nystagmus with positional testing. Patient did also present with some signs of peripheral vertigo which I recommended he participate with PT for. Just wanted to make you aware.  Thanks!  Janene Harvey, PT, DPT 01/14/22 2:42 PM  [facility] at [facility] [address] [place], [place], [place] [address] Phone # [phone] Fax # [phone]

## 2022-01-14 NOTE — Telephone Encounter (Signed)
Thank you for seeing the patient. I will order MRI orbits for further evaluation.

## 2022-01-15 ENCOUNTER — Ambulatory Visit: Payer: No Typology Code available for payment source

## 2022-01-17 ENCOUNTER — Ambulatory Visit: Payer: No Typology Code available for payment source | Admitting: Physical Therapy

## 2022-01-21 ENCOUNTER — Other Ambulatory Visit (HOSPITAL_BASED_OUTPATIENT_CLINIC_OR_DEPARTMENT_OTHER): Payer: Self-pay

## 2022-01-21 ENCOUNTER — Encounter: Payer: No Typology Code available for payment source | Admitting: Physical Therapy

## 2022-01-21 MED ORDER — PREDNISONE 5 MG PO TABS
ORAL_TABLET | ORAL | 0 refills | Status: DC
  Filled 2022-01-21: qty 21, 6d supply, fill #0

## 2022-01-23 ENCOUNTER — Encounter: Payer: No Typology Code available for payment source | Admitting: Physical Therapy

## 2022-01-28 ENCOUNTER — Encounter: Payer: No Typology Code available for payment source | Admitting: Physical Therapy

## 2022-01-30 ENCOUNTER — Encounter: Payer: No Typology Code available for payment source | Admitting: Physical Therapy

## 2022-02-04 ENCOUNTER — Encounter: Payer: No Typology Code available for payment source | Admitting: Physical Therapy

## 2022-02-06 ENCOUNTER — Encounter: Payer: Self-pay | Admitting: Neurology

## 2022-02-06 ENCOUNTER — Encounter: Payer: No Typology Code available for payment source | Admitting: Physical Therapy

## 2022-02-06 ENCOUNTER — Telehealth: Payer: Self-pay | Admitting: Neurology

## 2022-02-06 DIAGNOSIS — H8121 Vestibular neuronitis, right ear: Secondary | ICD-10-CM | POA: Insufficient documentation

## 2022-02-06 DIAGNOSIS — R42 Dizziness and giddiness: Secondary | ICD-10-CM | POA: Insufficient documentation

## 2022-02-06 DIAGNOSIS — H6991 Unspecified Eustachian tube disorder, right ear: Secondary | ICD-10-CM | POA: Insufficient documentation

## 2022-02-06 NOTE — Telephone Encounter (Signed)
I reviewed plan from 01/09/22 visit with Dr. April Manson:  Continue current medications  Continue with Meclizine 25 mg three time daily  Trial of Valium for the ongoing dizziness  Follow with primary care physician and consider referral to ENT  Follow up with vestibular PT as scheduled  Return if worse   The MRI order was placed on 01/14/22 with dx of double vision; I will confirm with MD on this?

## 2022-02-06 NOTE — Telephone Encounter (Signed)
I called the patient to schedule the MRI orbits w/wo contrast that was ordered and he said that he had two MRIs while in the ED in August. I said I see those but this MRI is different, but he said he did not remember being told about getting another MRI in his appointment with Dr. April Manson and would like a call back to discuss this before scheduling. He also asked me to note that he has an appointment with ENT today.

## 2022-02-07 NOTE — Telephone Encounter (Signed)
Spoke with patient, he is agreeable with the MRI Orbits. Please all and schedule MRI. Thanks

## 2022-02-10 NOTE — Telephone Encounter (Signed)
Scheduled at [facility] 02/18/22 at 7:30am 75 mins MR orbits w/wo contrast Dr. Ralph Dowdy: [id number] exp. 02/07/22-03/09/22

## 2022-02-11 ENCOUNTER — Encounter: Payer: No Typology Code available for payment source | Admitting: Physical Therapy

## 2022-02-13 ENCOUNTER — Encounter: Payer: No Typology Code available for payment source | Admitting: Physical Therapy

## 2022-02-18 ENCOUNTER — Encounter: Payer: No Typology Code available for payment source | Admitting: Physical Therapy

## 2022-02-18 ENCOUNTER — Ambulatory Visit: Payer: No Typology Code available for payment source

## 2022-02-18 DIAGNOSIS — H532 Diplopia: Secondary | ICD-10-CM | POA: Diagnosis not present

## 2022-02-18 MED ORDER — GADOBENATE DIMEGLUMINE 529 MG/ML IV SOLN
20.0000 mL | Freq: Once | INTRAVENOUS | Status: AC | PRN
  Administered 2022-02-18: 20 mL via INTRAVENOUS

## 2022-02-19 ENCOUNTER — Encounter: Payer: Self-pay | Admitting: Neurology

## 2022-02-20 ENCOUNTER — Encounter: Payer: No Typology Code available for payment source | Admitting: Physical Therapy

## 2022-02-24 ENCOUNTER — Telehealth: Payer: Self-pay | Admitting: Neurology

## 2022-02-24 NOTE — Telephone Encounter (Signed)
Noted, thank you

## 2022-02-24 NOTE — Telephone Encounter (Signed)
Pt's wife, Andrick Rust; asking why MRI was done for Orbit instead of the brain. Would like a call from the  nurse to discuss.

## 2022-02-24 NOTE — Telephone Encounter (Signed)
I called the pt's wife back. She reports multiple questions on why the MRI of the Brain was not ordered most recently. She sts she and the pt were under the impression that the most recent MRI was supposed to be MRI of Brain and not of the orbits.  She reports the pt continues to have worsening symptoms of balance and ringing in the ears. Sx have no improved over the last 2 months and are progressively getting worse. She wanted to know if MD could call and discuss concerns with her.

## 2022-02-24 NOTE — Telephone Encounter (Signed)
Spoke with patient, discussed in details reason for MRI orbits. Discussed normal results, all other questions answered.  He was seen by ENT and diagnosed with vestibular neuritis.

## 2022-03-05 ENCOUNTER — Telehealth: Payer: Self-pay | Admitting: Neurology

## 2022-03-05 NOTE — Telephone Encounter (Signed)
Received request from [employer] for MRI Orbits with questionnaire for MD printed records, giving to POD 2 to review and complete

## 2022-04-04 ENCOUNTER — Other Ambulatory Visit: Payer: Self-pay

## 2022-04-04 ENCOUNTER — Encounter: Payer: Self-pay | Admitting: Physical Therapy

## 2022-04-04 ENCOUNTER — Ambulatory Visit: Payer: No Typology Code available for payment source | Attending: Emergency Medicine | Admitting: Physical Therapy

## 2022-04-04 DIAGNOSIS — R42 Dizziness and giddiness: Secondary | ICD-10-CM | POA: Insufficient documentation

## 2022-04-04 DIAGNOSIS — R2689 Other abnormalities of gait and mobility: Secondary | ICD-10-CM | POA: Insufficient documentation

## 2022-04-04 DIAGNOSIS — R2681 Unsteadiness on feet: Secondary | ICD-10-CM | POA: Insufficient documentation

## 2022-04-04 NOTE — Therapy (Signed)
OUTPATIENT PHYSICAL THERAPY VESTIBULAR EVALUATION     Patient Name: Blake Curtis MRN: [id number] DOB:[birth year], 61 y.o., male Today's Date: 04/04/2022   PT End of Session - 04/04/22 1239     Visit Number 1    Number of Visits 13    Date for PT Re-Evaluation 05/16/22    Authorization Type [employer]    Authorization - Visit Number 1   1 previous visit 2023   Authorization - Number of Visits 35    PT Start Time [phone]    PT Stop Time 0900    PT Time Calculation (min) 50 min    Activity Tolerance Patient tolerated treatment well    Behavior During Therapy [facility] for tasks assessed/performed             Past Medical History:  Diagnosis Date   Diabetes mellitus without complication ([place])    diet controlled   Hypertension    Sleep apnea    No CPAP   Past Surgical History:  Procedure Laterality Date   APPENDECTOMY  2002   TRANSURETHRAL RESECTION OF PROSTATE N/A 03/19/2020   Procedure: TRANSURETHRAL RESECTION OF THE PROSTATE (TURP) WITH CYSTOSCOPY;  Surgeon: Raynelle Bring, MD;  Location: [facility];  Service: Urology;  Laterality: N/A;  ANESTHESIA GENERAL OR SPINAL PER DR Alinda Money   Patient Active Problem List   Diagnosis Date Noted   Bladder outlet obstruction 03/19/2020   Sarcoidosis 03/30/2019   Dysphagia 03/30/2019   Prostate nodule 04/29/2013   Benign neoplasm of colon 04/29/2013    PCP: Burnard Bunting, MD REFERRING PROVIDER: Burnard Bunting, MD  REFERRING DIAG: H81.10 (ICD-10-CM) - Benign paroxysmal vertigo, unspecified ear  THERAPY DIAG:  Dizziness and giddiness  Unsteadiness on feet  ONSET DATE: Mid-August  Rationale for Evaluation and Treatment: Rehabilitation  SUBJECTIVE:   SUBJECTIVE STATEMENT: Have had some additional testing and it's just not getting any better.  Still feeling the dizziness.  Room isn't spinning anymore.  Just feel unsteadiness. Pt accompanied by: self  PERTINENT HISTORY: dx of vestibular neuritis, see above PMH  PAIN:   Are you having pain? No  PRECAUTIONS: None  WEIGHT BEARING RESTRICTIONS: No  FALLS: Has patient fallen in last 6 months? No   PLOF: Independent  PATIENT GOALS: To feel better, get rid of dizziness feeling  OBJECTIVE:   DIAGNOSTIC FINDINGS: MRI of orbits unremarkable; MRI brain 12/2021  COGNITION: Overall cognitive status: Within functional limits for tasks assessed   PATIENT SURVEYS:  FOTO 53 at intake; 63 predicted for d/c  VESTIBULAR ASSESSMENT:  GENERAL OBSERVATION: No acute distress.  Does report sensation in R ear and dizziness when he reaches to touch head   SYMPTOM BEHAVIOR:  Subjective history: see above; ongoing dizziness/unsteadiness for several months. Coming back to therapy after additional MRI and follow up with MD.  Non-Vestibular symptoms: changes in hearing and muffled hearing on R side  Type of dizziness: Imbalance (Disequilibrium), Unsteady with head/body turns, "Funny feeling in the head", and "Swimmyheaded"  Frequency: Daily   Duration: Constant  Aggravating factors: Induced by motion: looking up at the ceiling, turning body quickly, turning head quickly, and upon first standing   Relieving factors: slow movements  Progression of symptoms: unchanged  OCULOMOTOR EXAM:  Ocular Alignment: abnormal and L eye elevated in orbit  Ocular ROM:  R eye doesn't elevate as much as L eye  Spontaneous Nystagmus: absent  Gaze-Induced Nystagmus: absent  Smooth Pursuits: intact and pt reports diplopia with horizontal movements  Saccades: intact and slightly slower  inferior/superior directions  Convergence/Divergence: 4-5 cm    VESTIBULAR - OCULAR REFLEX:   Slow VOR: Normal and Comment: c/o mild dizziness 3/10 in vertical directions  VOR Cancellation: Normal  Head-Impulse Test: HIT Right: positive HIT Left: negative    POSITIONAL TESTING: Right Dix-Hallpike: c/o minimal dizziness Left Dix-Hallpike: upbeating, left nystagmus, c/o dizziness, and Duration: 10  sec Right Roll Test: no nystagmus Left Roll Test: geotropic nystagmus, dizziness, and Duration: 15  MOTION SENSITIVITY:  Motion Sensitivity Quotient Intensity: 0 = none, 1 = Lightheaded, 2 = Mild, 3 = Moderate, 4 = Severe, 5 = Vomiting  Intensity  1. Sitting to supine   2. Supine to L side   3. Supine to R side   4. Supine to sitting   5. L Hallpike-Dix   6. Up from L    7. R Hallpike-Dix   8. Up from R    9. Sitting, head tipped to L knee 2  10. Head up from L knee 2  11. Sitting, head tipped to R knee 2  12. Head up from R knee 2 (slightly more than L)  13. Sitting head turns x5 2  14.Sitting head nods x5 2+  15. In stance, 180 turn to L  2+  16. In stance, 180 turn to R 2    OTHOSTATICS: not done   VESTIBULAR TREATMENT:                                                                                                   DATE: 04/04/2022  Canalith Repositioning:  BBQ Roll Left: Number of Reps: 1, Response to Treatment: symptoms improved, and Comment: mild unsteadiness upon return to sit  PATIENT EDUCATION: Education details: PT eval results, POC; rationale for canal repositioning and to continue VOR exercises from previous eval that he was doing. Person educated: Patient Education method: Explanation Education comprehension: verbalized understanding  HOME EXERCISE PROGRAM: Access Code: [email or web address] URL: [email or web address] Date: 01/14/2022 Prepared by: [facility]   Exercises - Seated Gaze Stabilization with Head Rotation  - 1 x daily - 5 x weekly - 2 sets - 30 sec hold - Seated Gaze Stabilization with Head Nod  - 1 x daily - 5 x weekly - 2 sets - 30 sec hold - Seated Horizontal Smooth Pursuit  - 1 x daily - 5 x weekly - 2 sets - 30 sec hold  GOALS: Goals reviewed with patient? Yes  SHORT TERM GOALS: Target date: 05/02/2022  Patient to be independent with initial HEP.  Baseline: Goal status: INITIAL   LONG  TERM GOALS: Target date: 05/16/2022  Patient to be independent with advanced HEP.  Baseline:  Goal status: INITIAL  2.  Patient to report 0/10 dizziness with standing vertical and horizontal VOR for 30 seconds to improve functional mobility. Baseline: seated rating 3/10 dizziness Goal status: INITIAL  3.  Patient will report 0/10 dizziness with bed mobility.  Baseline: symptomatic with dix-hallpike and horizontal roll to L Goal status: INITIAL  4.  Pt will improve FOTO score to at least 63  for improved functional mobility. Baseline: 53 initial intake Goal status: INITIAL   ASSESSMENT:  CLINICAL IMPRESSION: Patient is a 61 y.o. male who was seen today for physical therapy evaluation and treatment for BPPV and dizziness.   Pt reports episodes of dizziness are constant through the day.  He has been doing exercises from previous HEP/PT eval, however, he continues to report unsteadiness and dizziness.  Pt had MRI done to orbits based on eye positioning and this was unremarkable.  Oculomotor exam revealed L eye elevated in orbit, decreased ability of R eye to elevate with superior gaze, diplopia with horizontal smooth pursuit, slow vertical saccades, dizziness with vertical and horizontal VOR, positive R HIT. Positional testing revealed dizziness and nonspecific nystagmus with R DH as well as geotropic nystagmus with L horizontal roll.  Treated L horizontal canal BPPV today and will plan to reassess next session.  He will benefit from skilled PT to address above mentioned impairments for improved overall functional mobility.  OBJECTIVE IMPAIRMENTS: Abnormal gait, decreased balance, and dizziness.   ACTIVITY LIMITATIONS: bending, standing, squatting, sleeping, transfers, bed mobility, and locomotion level  PARTICIPATION LIMITATIONS: cleaning, laundry, shopping, and community activity  PERSONAL FACTORS: 3+ comorbidities: see above  are also affecting patient's functional outcome.   REHAB  POTENTIAL: Good  CLINICAL DECISION MAKING: Evolving/moderate complexity  EVALUATION COMPLEXITY: Moderate   PLAN:  PT FREQUENCY: 2x/week  PT DURATION: 6 weeks  PLANNED INTERVENTIONS: Therapeutic exercises, Therapeutic activity, Neuromuscular re-education, Balance training, Gait training, Patient/Family education, Self Care, Vestibular training, and Canalith repositioning  PLAN FOR NEXT SESSION: Reassess positional testing and treat as appropriate.  Habituation for rolling, functional activities.  VOR sitting>standing.  Assess MCTSIB   Frazier Butt., PT 04/04/2022, 12:41 PM  [facility] [place], [place], [place] [address] Phone # [phone] Fax # [phone]

## 2022-04-10 ENCOUNTER — Ambulatory Visit: Payer: No Typology Code available for payment source | Admitting: Physical Therapy

## 2022-04-10 ENCOUNTER — Encounter: Payer: Self-pay | Admitting: Physical Therapy

## 2022-04-10 DIAGNOSIS — R42 Dizziness and giddiness: Secondary | ICD-10-CM

## 2022-04-10 DIAGNOSIS — R2681 Unsteadiness on feet: Secondary | ICD-10-CM

## 2022-04-10 DIAGNOSIS — R2689 Other abnormalities of gait and mobility: Secondary | ICD-10-CM

## 2022-04-10 NOTE — Therapy (Signed)
OUTPATIENT PHYSICAL THERAPY VESTIBULAR TREATMENT NOTES     Patient Name: Blake Curtis MRN: [id number] DOB:1960-08-17, 61 y.o., male Today's Date: 04/10/2022   PT End of Session - 04/10/22 0758     Visit Number 2    Number of Visits 13    Date for PT Re-Evaluation 05/16/22    Authorization Type [employer]    Authorization - Visit Number 2   1 previous visit 2023   Authorization - Number of Visits 35    PT Start Time 0800    PT Stop Time 0850    PT Time Calculation (min) 50 min    Activity Tolerance Patient tolerated treatment well    Behavior During Therapy [facility] for tasks assessed/performed              Past Medical History:  Diagnosis Date   Diabetes mellitus without complication ([place])    diet controlled   Hypertension    Sleep apnea    No CPAP   Past Surgical History:  Procedure Laterality Date   APPENDECTOMY  2002   TRANSURETHRAL RESECTION OF PROSTATE N/A 03/19/2020   Procedure: TRANSURETHRAL RESECTION OF THE PROSTATE (TURP) WITH CYSTOSCOPY;  Surgeon: Raynelle Bring, MD;  Location: [facility];  Service: Urology;  Laterality: N/A;  ANESTHESIA GENERAL OR SPINAL PER DR Alinda Money   Patient Active Problem List   Diagnosis Date Noted   Bladder outlet obstruction 03/19/2020   Sarcoidosis 03/30/2019   Dysphagia 03/30/2019   Prostate nodule 04/29/2013   Benign neoplasm of colon 04/29/2013    PCP: Burnard Bunting, MD REFERRING PROVIDER: Burnard Bunting, MD  REFERRING DIAG: H81.10 (ICD-10-CM) - Benign paroxysmal vertigo, unspecified ear  THERAPY DIAG:  Dizziness and giddiness  Unsteadiness on feet  Other abnormalities of gait and mobility  ONSET DATE: Mid-August  Rationale for Evaluation and Treatment: Rehabilitation  SUBJECTIVE:   SUBJECTIVE STATEMENT: Nothing has changed.  Still dizzy.  Did the exercises, and "it eased the dizziness a little."  Rates symptoms as 5/10 and reports off-balance. Pt accompanied by: self  PERTINENT HISTORY: dx of  vestibular neuritis, see above PMH  PAIN:  Are you having pain? No  PRECAUTIONS: None  WEIGHT BEARING RESTRICTIONS: No  FALLS: Has patient fallen in last 6 months? No   PLOF: Independent  PATIENT GOALS: To feel better, get rid of dizziness feeling  OBJECTIVE:    TODAY'S TREATMENT: 04/10/2022 Activity Comments  DGI 20/24 (see below)   Reviewed HEP: -Seated gaze stabilization horizontal-no symptoms -Seated gaze stabilization vertical-mild symptoms -Seated horizontal smooth pursuits-no symptoms   Standing gaze stabilization: Horizontal gaze-to R double vision (no incr. In dizziness symtpoms) -Vertical (slight increase in symptoms, no doubling)   Pencil pushups No dizziness  Brock string exercises Closest target is 4-6 inches away from nose prior to double vision  L DH Brief report of dizziness, very short nystagmus  R DH Negative, no nystagmus  Horizontal roll R and L Negative, no nystagmus  Brandt-Daroff, R and L x 1 rep C/o mild dizziness symptoms, resolves quickly  Upon attempt at L Epley-no nystagmus noted, did not proceed     M-CTSIB  Condition 1: Firm Surface, EO 30 Sec, Normal Sway  Condition 2: Firm Surface, EC 30 Sec, Mild Sway  Condition 3: Foam Surface, EO 30 Sec, Mild Sway  Condition 4: Foam Surface, EC 30 Sec, Mild and Moderate Sway    With condition 4:  pt reports brings on symptoms of unsteadiness    [facility] PT Assessment - 04/10/22 0001  Standardized Balance Assessment   Standardized Balance Assessment Dynamic Gait Index      Dynamic Gait Index   Level Surface Normal   5.37 sec   Change in Gait Speed Normal    Gait with Horizontal Head Turns Mild Impairment   6.6   Gait with Vertical Head Turns Mild Impairment   7.1 sec with reports of "boom"   Gait and Pivot Turn Mild Impairment    Step Over Obstacle Normal    Step Around Obstacles Normal    Steps Mild Impairment    Total Score 20    DGI comment: Scores <19/24 indicate increased fall risk             Access Code: [email or web address] URL: [email or web address] Date: 04/10/2022 Prepared by: [facility]  Exercises - Seated Gaze Stabilization with Head Nod  - 1 x daily - 5 x weekly - 2 sets - 30 sec hold - Brandt-Daroff Vestibular Exercise  - 1 x daily - 7 x weekly - 1 sets - 3-5 reps - Standing Gaze Stabilization with Head Nod  - 1-2 x daily - 7 x weekly - 1 sets - 2 reps - 30 sec hold  PATIENT EDUCATION: Education details: Rationale for vestibular treatment/habituation, apparent resolved BPPV today; updates to HEP Person educated: Patient Education method: Explanation, Demonstration, Handouts, and emailed [facility] HEP Education comprehension: verbalized understanding and returned demonstration  ---------------------------------------------------------------------- DIAGNOSTIC FINDINGS: MRI of orbits unremarkable; MRI brain 12/2021  COGNITION: Overall cognitive status: Within functional limits for tasks assessed   PATIENT SURVEYS:  FOTO 53 at intake; 63 predicted for d/c  VESTIBULAR ASSESSMENT:  GENERAL OBSERVATION: No acute distress.  Does report sensation in R ear and dizziness when he reaches to touch head   SYMPTOM BEHAVIOR:  Subjective history: see above; ongoing dizziness/unsteadiness for several months. Coming back to therapy after additional MRI and follow up with MD.  Non-Vestibular symptoms: changes in hearing and muffled hearing on R side  Type of dizziness: Imbalance (Disequilibrium), Unsteady with head/body turns, "Funny feeling in the head", and "Swimmyheaded"  Frequency: Daily   Duration: Constant  Aggravating factors: Induced by motion: looking up at the ceiling, turning body quickly, turning head quickly, and upon first standing   Relieving factors: slow movements  Progression of symptoms: unchanged  OCULOMOTOR EXAM:  Ocular Alignment: abnormal and L eye elevated in orbit  Ocular ROM:  R eye doesn't  elevate as much as L eye  Spontaneous Nystagmus: absent  Gaze-Induced Nystagmus: absent  Smooth Pursuits: intact and pt reports diplopia with horizontal movements  Saccades: intact and slightly slower inferior/superior directions  Convergence/Divergence: 4-5 cm    VESTIBULAR - OCULAR REFLEX:   Slow VOR: Normal and Comment: c/o mild dizziness 3/10 in vertical directions  VOR Cancellation: Normal  Head-Impulse Test: HIT Right: positive HIT Left: negative    POSITIONAL TESTING: Right Dix-Hallpike: c/o minimal dizziness Left Dix-Hallpike: upbeating, left nystagmus, c/o dizziness, and Duration: 10 sec Right Roll Test: no nystagmus Left Roll Test: geotropic nystagmus, dizziness, and Duration: 15  MOTION SENSITIVITY:  Motion Sensitivity Quotient Intensity: 0 = none, 1 = Lightheaded, 2 = Mild, 3 = Moderate, 4 = Severe, 5 = Vomiting  Intensity  1. Sitting to supine   2. Supine to L side   3. Supine to R side   4. Supine to sitting   5. L Hallpike-Dix   6. Up from L    7. R Hallpike-Dix   8. Up from  R    9. Sitting, head tipped to L knee 2  10. Head up from L knee 2  11. Sitting, head tipped to R knee 2  12. Head up from R knee 2 (slightly more than L)  13. Sitting head turns x5 2  14.Sitting head nods x5 2+  15. In stance, 180 turn to L  2+  16. In stance, 180 turn to R 2    OTHOSTATICS: not done   VESTIBULAR TREATMENT:                                                                                                   DATE: 04/04/2022  Canalith Repositioning:  BBQ Roll Left: Number of Reps: 1, Response to Treatment: symptoms improved, and Comment: mild unsteadiness upon return to sit  PATIENT EDUCATION: Education details: PT eval results, POC; rationale for canal repositioning and to continue VOR exercises from previous eval that he was doing. Person educated: Patient Education method: Explanation Education comprehension: verbalized understanding  HOME EXERCISE  PROGRAM: Access Code: [email or web address] URL: [email or web address] Date: 01/14/2022 Prepared by: [facility]   Exercises - Seated Gaze Stabilization with Head Rotation  - 1 x daily - 5 x weekly - 2 sets - 30 sec hold - Seated Gaze Stabilization with Head Nod  - 1 x daily - 5 x weekly - 2 sets - 30 sec hold - Seated Horizontal Smooth Pursuit  - 1 x daily - 5 x weekly - 2 sets - 30 sec hold  GOALS: Goals reviewed with patient? Yes  SHORT TERM GOALS: Target date: 05/02/2022  Patient to be independent with initial HEP.  Baseline: Goal status: INITIAL   LONG TERM GOALS: Target date: 05/16/2022  Patient to be independent with advanced HEP.  Baseline:  Goal status: INITIAL  2.  Patient to report 0/10 dizziness with standing vertical and horizontal VOR for 30 seconds to improve functional mobility. Baseline: seated rating 3/10 dizziness Goal status: INITIAL  3.  Patient will report 0/10 dizziness with bed mobility.  Baseline: symptomatic with dix-hallpike and horizontal roll to L Goal status: INITIAL  4.  Pt will improve FOTO score to at least 63 for improved functional mobility. Baseline: 53 initial intake Goal status: INITIAL   ASSESSMENT:  CLINICAL IMPRESSION: Assessed MCTSIB and DGI today, with mild sway on conditions 2 and 3; mild-mod sway on condition 4.  DGI score is 20/24, indicating mild risk of falls.  He does report baseline sensation of dizziness today throughout session as 5/10, and this is unchanged at end of session.  Reassessed positional vertigo testing today, and pt does not have symptoms or nystagmus, except briefly in L Dix-Hallpike position (this is resolved at attempts to treat).  He does report double vision on R eye with horizontal gaze stabilization in standing, but his symptoms of dizziness are worse with vertical head motions today.  Will continue to work on finding habituation movements for improving dizziness and  unsteadiness and will continue to monitor for positional vertigo.    OBJECTIVE IMPAIRMENTS: Abnormal gait, decreased  balance, and dizziness.   ACTIVITY LIMITATIONS: bending, standing, squatting, sleeping, transfers, bed mobility, and locomotion level  PARTICIPATION LIMITATIONS: cleaning, laundry, shopping, and community activity  PERSONAL FACTORS: 3+ comorbidities: see above  are also affecting patient's functional outcome.   REHAB POTENTIAL: Good  CLINICAL DECISION MAKING: Evolving/moderate complexity  EVALUATION COMPLEXITY: Moderate   PLAN:  PT FREQUENCY: 2x/week  PT DURATION: 6 weeks  PLANNED INTERVENTIONS: Therapeutic exercises, Therapeutic activity, Neuromuscular re-education, Balance training, Gait training, Patient/Family education, Self Care, Vestibular training, and Canalith repositioning  PLAN FOR NEXT SESSION: Reassess positional testing and treat as appropriate.  Habituation for rolling, functional activities.  VOR standing. Compliant surface activities.  Review HEP.   Frazier Butt., PT 04/10/2022, 9:47 AM  [facility] at [facility] [place], [place], [place] [address] Phone # [phone] Fax # [phone]

## 2022-04-15 ENCOUNTER — Ambulatory Visit: Payer: No Typology Code available for payment source

## 2022-04-15 DIAGNOSIS — R2681 Unsteadiness on feet: Secondary | ICD-10-CM

## 2022-04-15 DIAGNOSIS — R42 Dizziness and giddiness: Secondary | ICD-10-CM | POA: Diagnosis not present

## 2022-04-15 DIAGNOSIS — R2689 Other abnormalities of gait and mobility: Secondary | ICD-10-CM

## 2022-04-15 NOTE — Therapy (Signed)
OUTPATIENT PHYSICAL THERAPY VESTIBULAR TREATMENT NOTES     Patient Name: Blake Curtis MRN: [id number] DOB:12-Nov-1960, 61 y.o., male Today's Date: 04/15/2022   PT End of Session - 04/15/22 0800     Visit Number 3    Number of Visits 13    Date for PT Re-Evaluation 05/16/22    Authorization Type [employer]    Authorization - Visit Number 3    Authorization - Number of Visits 35    PT Start Time 0800    PT Stop Time 0845    PT Time Calculation (min) 45 min    Activity Tolerance Patient tolerated treatment well    Behavior During Therapy [facility] for tasks assessed/performed              Past Medical History:  Diagnosis Date   Diabetes mellitus without complication ([place])    diet controlled   Hypertension    Sleep apnea    No CPAP   Past Surgical History:  Procedure Laterality Date   APPENDECTOMY  2002   TRANSURETHRAL RESECTION OF PROSTATE N/A 03/19/2020   Procedure: TRANSURETHRAL RESECTION OF THE PROSTATE (TURP) WITH CYSTOSCOPY;  Surgeon: Raynelle Bring, MD;  Location: [facility];  Service: Urology;  Laterality: N/A;  ANESTHESIA GENERAL OR SPINAL PER DR Alinda Money   Patient Active Problem List   Diagnosis Date Noted   Bladder outlet obstruction 03/19/2020   Sarcoidosis 03/30/2019   Dysphagia 03/30/2019   Prostate nodule 04/29/2013   Benign neoplasm of colon 04/29/2013    PCP: Burnard Bunting, MD REFERRING PROVIDER: Burnard Bunting, MD  REFERRING DIAG: H81.10 (ICD-10-CM) - Benign paroxysmal vertigo, unspecified ear  THERAPY DIAG:  Dizziness and giddiness  Unsteadiness on feet  Other abnormalities of gait and mobility  ONSET DATE: Mid-August  Rationale for Evaluation and Treatment: Rehabilitation  SUBJECTIVE:   SUBJECTIVE STATEMENT: Not much has changed.  Symptoms begin once movement begins, e.g. sitting to side of bed. Also noting symptoms with up/down head movements  Pt accompanied by: self  PERTINENT HISTORY: dx of vestibular neuritis, see above  PMH  PAIN:  Are you having pain? No  PRECAUTIONS: None  WEIGHT BEARING RESTRICTIONS: No  FALLS: Has patient fallen in last 6 months? No   PLOF: Independent  PATIENT GOALS: To feel better, get rid of dizziness feeling  OBJECTIVE:   TODAY'S TREATMENT: 04/15/22 Activity Comments  Left DH No nystagmus, slight symptoms  Right DH Same as left  Left roll test No nystagmus, slight symptoms  Right roll test Same as left  Seated gaze stabilization 2x30 Vertical-mild symptoms Horizontal- less intense than vertical  Corner balance -feet together EC, EC and head turns--mild sway -tandem EC x 15 sec -standing on foam: EC, EC head turns 5x        TODAY'S TREATMENT: 04/10/2022 Activity Comments  DGI 20/24 (see below)   Reviewed HEP: -Seated gaze stabilization horizontal-no symptoms -Seated gaze stabilization vertical-mild symptoms -Seated horizontal smooth pursuits-no symptoms   Standing gaze stabilization: Horizontal gaze-to R double vision (no incr. In dizziness symtpoms) -Vertical (slight increase in symptoms, no doubling)   Pencil pushups No dizziness  Brock string exercises Closest target is 4-6 inches away from nose prior to double vision  L DH Brief report of dizziness, very short nystagmus  R DH Negative, no nystagmus  Horizontal roll R and L Negative, no nystagmus  Brandt-Daroff, R and L x 1 rep C/o mild dizziness symptoms, resolves quickly  Upon attempt at L Epley-no nystagmus noted, did not proceed  M-CTSIB  Condition 1: Firm Surface, EO 30 Sec, Normal Sway  Condition 2: Firm Surface, EC 30 Sec, Mild Sway  Condition 3: Foam Surface, EO 30 Sec, Mild Sway  Condition 4: Foam Surface, EC 30 Sec, Mild and Moderate Sway    With condition 4:  pt reports brings on symptoms of unsteadiness     Access Code: [email or web address] URL: [email or web address] Date: 04/10/2022 Prepared by: [facility]  Exercises - Seated Gaze  Stabilization with Head Nod  - 1 x daily - 5 x weekly - 2 sets - 30 sec hold - Brandt-Daroff Vestibular Exercise  - 1 x daily - 7 x weekly - 1 sets - 3-5 reps - Standing Gaze Stabilization with Head Nod  - 1-2 x daily - 7 x weekly - 1 sets - 2 reps - 30 sec hold  PATIENT EDUCATION: Education details: Rationale for vestibular treatment/habituation, apparent resolved BPPV today; updates to HEP Person educated: Patient Education method: Explanation, Demonstration, Handouts, and emailed [place] HEP Education comprehension: verbalized understanding and returned demonstration  ---------------------------------------------------------------------- DIAGNOSTIC FINDINGS: MRI of orbits unremarkable; MRI brain 12/2021  COGNITION: Overall cognitive status: Within functional limits for tasks assessed   PATIENT SURVEYS:  FOTO 53 at intake; 63 predicted for d/c  VESTIBULAR ASSESSMENT:  GENERAL OBSERVATION: No acute distress.  Does report sensation in R ear and dizziness when he reaches to touch head   SYMPTOM BEHAVIOR:  Subjective history: see above; ongoing dizziness/unsteadiness for several months. Coming back to therapy after additional MRI and follow up with MD.  Non-Vestibular symptoms: changes in hearing and muffled hearing on R side  Type of dizziness: Imbalance (Disequilibrium), Unsteady with head/body turns, "Funny feeling in the head", and "Swimmyheaded"  Frequency: Daily   Duration: Constant  Aggravating factors: Induced by motion: looking up at the ceiling, turning body quickly, turning head quickly, and upon first standing   Relieving factors: slow movements  Progression of symptoms: unchanged  OCULOMOTOR EXAM:  Ocular Alignment: abnormal and L eye elevated in orbit  Ocular ROM:  R eye doesn't elevate as much as L eye  Spontaneous Nystagmus: absent  Gaze-Induced Nystagmus: absent  Smooth Pursuits: intact and pt reports diplopia with horizontal movements  Saccades: intact and  slightly slower inferior/superior directions  Convergence/Divergence: 4-5 cm    VESTIBULAR - OCULAR REFLEX:   Slow VOR: Normal and Comment: c/o mild dizziness 3/10 in vertical directions  VOR Cancellation: Normal  Head-Impulse Test: HIT Right: positive HIT Left: negative    POSITIONAL TESTING: Right Dix-Hallpike: c/o minimal dizziness Left Dix-Hallpike: upbeating, left nystagmus, c/o dizziness, and Duration: 10 sec Right Roll Test: no nystagmus Left Roll Test: geotropic nystagmus, dizziness, and Duration: 15  MOTION SENSITIVITY:  Motion Sensitivity Quotient Intensity: 0 = none, 1 = Lightheaded, 2 = Mild, 3 = Moderate, 4 = Severe, 5 = Vomiting  Intensity  1. Sitting to supine   2. Supine to L side   3. Supine to R side   4. Supine to sitting   5. L Hallpike-Dix   6. Up from L    7. R Hallpike-Dix   8. Up from R    9. Sitting, head tipped to L knee 2  10. Head up from L knee 2  11. Sitting, head tipped to R knee 2  12. Head up from R knee 2 (slightly more than L)  13. Sitting head turns x5 2  14.Sitting head nods x5 2+  15. In stance, 180 turn  to L  2+  16. In stance, 180 turn to R 2    OTHOSTATICS: not done   VESTIBULAR TREATMENT:                                                                                                   DATE: 04/04/2022  Canalith Repositioning:  BBQ Roll Left: Number of Reps: 1, Response to Treatment: symptoms improved, and Comment: mild unsteadiness upon return to sit  PATIENT EDUCATION: Education details: PT eval results, POC; rationale for canal repositioning and to continue VOR exercises from previous eval that he was doing. Person educated: Patient Education method: Explanation Education comprehension: verbalized understanding  HOME EXERCISE PROGRAM: Access Code: [email or web address] URL: [email or web address] Date: 01/14/2022 Prepared by: [facility]   Exercises - Seated Gaze Stabilization  with Head Rotation  - 1 x daily - 5 x weekly - 2 sets - 30 sec hold - Seated Gaze Stabilization with Head Nod  - 1 x daily - 5 x weekly - 2 sets - 30 sec hold - Seated Horizontal Smooth Pursuit  - 1 x daily - 5 x weekly - 2 sets - 30 sec hold - Corner Balance Feet Together: Eyes Closed With Head Turns  - 1 x daily - 7 x weekly - 3 sets - 5 reps - Semi-Tandem Corner Balance With Eyes Closed  - 1 x daily - 7 x weekly - 3 sets - 15-30 sec hold  GOALS: Goals reviewed with patient? Yes  SHORT TERM GOALS: Target date: 05/02/2022  Patient to be independent with initial HEP.  Baseline: Goal status: On-going   LONG TERM GOALS: Target date: 05/16/2022  Patient to be independent with advanced HEP.  Baseline:  Goal status: INITIAL  2.  Patient to report 0/10 dizziness with standing vertical and horizontal VOR for 30 seconds to improve functional mobility. Baseline: seated rating 3/10 dizziness Goal status: INITIAL  3.  Patient will report 0/10 dizziness with bed mobility.  Baseline: symptomatic with dix-hallpike and horizontal roll to L Goal status: INITIAL  4.  Pt will improve FOTO score to at least 63 for improved functional mobility. Baseline: 53 initial intake Goal status: INITIAL   ASSESSMENT:  CLINICAL IMPRESSION: No nystagmus or reports of vertigo with positional tests, main issue arises with the sudden position changes that the maneuvers invoke with report of unsteadiness/uneasy feeling vs dizziness.  Educated and advised on intensity of gaze stabilization activities e.g. increased frequency vs amplitude and pushing symptoms to a 4/10 on discomfort.  Initiated corner balance activities with moderate sway with tandem position and moderate-severe with eyes clsoed and head movements in tandem or foam. Continued sessions to progress vestibular rehab    OBJECTIVE IMPAIRMENTS: Abnormal gait, decreased balance, and dizziness.   ACTIVITY LIMITATIONS: bending, standing, squatting,  sleeping, transfers, bed mobility, and locomotion level  PARTICIPATION LIMITATIONS: cleaning, laundry, shopping, and community activity  PERSONAL FACTORS: 3+ comorbidities: see above  are also affecting patient's functional outcome.   REHAB POTENTIAL: Good  CLINICAL DECISION MAKING: Evolving/moderate complexity  EVALUATION COMPLEXITY: Moderate  PLAN:  PT FREQUENCY: 2x/week  PT DURATION: 6 weeks  PLANNED INTERVENTIONS: Therapeutic exercises, Therapeutic activity, Neuromuscular re-education, Balance training, Gait training, Patient/Family education, Self Care, Vestibular training, and Canalith repositioning  PLAN FOR NEXT SESSION: Reassess positional testing and treat as appropriate.  Habituation for rolling, functional activities.  VOR standing. Compliant surface activities.  Review HEP.   Toniann Fail, PT 04/15/2022, 8:00 AM  [facility] at [facility] [place], [place], [place] [address] Phone # [phone] Fax # [phone]

## 2022-04-16 NOTE — Therapy (Signed)
OUTPATIENT PHYSICAL THERAPY VESTIBULAR TREATMENT NOTES     Patient Name: Blake Curtis MRN: [id number] DOB:1960-06-26, 61 y.o., male Today's Date: 04/21/2022   PT End of Session - 04/21/22 0843     Visit Number 4    Number of Visits 13    Date for PT Re-Evaluation 05/16/22    Authorization Type [employer]    Authorization - Visit Number 4    Authorization - Number of Visits 35    PT Start Time 0800    PT Stop Time [phone]    PT Time Calculation (min) 42 min    Activity Tolerance Patient tolerated treatment well    Behavior During Therapy [facility] for tasks assessed/performed               Past Medical History:  Diagnosis Date   Diabetes mellitus without complication ([place])    diet controlled   Hypertension    Sleep apnea    No CPAP   Past Surgical History:  Procedure Laterality Date   APPENDECTOMY  2002   TRANSURETHRAL RESECTION OF PROSTATE N/A 03/19/2020   Procedure: TRANSURETHRAL RESECTION OF THE PROSTATE (TURP) WITH CYSTOSCOPY;  Surgeon: Raynelle Bring, MD;  Location: [facility];  Service: Urology;  Laterality: N/A;  ANESTHESIA GENERAL OR SPINAL PER DR Alinda Money   Patient Active Problem List   Diagnosis Date Noted   Bladder outlet obstruction 03/19/2020   Sarcoidosis 03/30/2019   Dysphagia 03/30/2019   Prostate nodule 04/29/2013   Benign neoplasm of colon 04/29/2013    PCP: Burnard Bunting, MD REFERRING PROVIDER: Burnard Bunting, MD  REFERRING DIAG: H81.10 (ICD-10-CM) - Benign paroxysmal vertigo, unspecified ear  THERAPY DIAG:  Dizziness and giddiness  Unsteadiness on feet  ONSET DATE: Mid-August  Rationale for Evaluation and Treatment: Rehabilitation  SUBJECTIVE:   SUBJECTIVE STATEMENT: "It is not getting better. I don't know what to do now." Standing up and walking or rubbing head while in the shower aggravates symptoms. Doing the exercises at home- at least 3x/day.   Pt accompanied by: self  PERTINENT HISTORY: dx of vestibular neuritis, see  above PMH  PAIN:  Are you having pain? No  PRECAUTIONS: None  WEIGHT BEARING RESTRICTIONS: No  FALLS: Has patient fallen in last 6 months? No   PLOF: Independent  PATIENT GOALS: To feel better, get rid of dizziness feeling  OBJECTIVE:     TODAY'S TREATMENT: 04/21/22 Activity Comments  Seated Gaze Stabilization with Head Nod 30" 4/10 dizziness  Seated Gaze Stabilization with Head Nod and head turned to the R 30" 6/10 dizziness; cueing to slow down and 4/10 dizziness   Standing Gaze Stabilization with Head Nod 30"  Cueing to decrease amplitude; 5/10 dizziness   Brandt-Daroff Vestibular Exercise R R upbeating torsional nystagmus lasting ~15 sec   Brandt-Daroff Vestibular Exercise L L upbeating torsional nystagmus lasting ~15 sec  R DH Non-specific nystagmus lasting ~5 sec  R epley  Tolerated well   R DH Brief c/o diplopia which resolved upon looking to midline; no nystagmus   R epley  Tolerated well            PATIENT EDUCATION: Education details: edu on vestibular hypofunction and time/consistency with HEP in order for neuroplastic changes to occur; edu on labyrinth anatomy; 2 handouts on BPPV and vestibular hypofunction Person educated: Patient Education method: Explanation, Demonstration, Tactile cues, Verbal cues, and Handouts Education comprehension: verbalized understanding and returned demonstration    Access Code: [email or web address] URL: [email or web address] Date: 04/10/2022 Prepared by: [facility] - [facility]  Exercises - Seated Gaze Stabilization with Head Nod  - 1 x daily - 5 x weekly - 2 sets - 30 sec hold - Brandt-Daroff Vestibular Exercise  - 1 x daily - 7 x weekly - 1 sets - 3-5 reps - Standing Gaze Stabilization with Head Nod  - 1-2 x daily - 7 x weekly - 1 sets - 2 reps - 30 sec hold   ---------------------------------------------------------------------- DIAGNOSTIC FINDINGS: MRI of orbits unremarkable; MRI brain  12/2021  COGNITION: Overall cognitive status: Within functional limits for tasks assessed   PATIENT SURVEYS:  FOTO 53 at intake; 63 predicted for d/c  VESTIBULAR ASSESSMENT:  GENERAL OBSERVATION: No acute distress.  Does report sensation in R ear and dizziness when he reaches to touch head   SYMPTOM BEHAVIOR:  Subjective history: see above; ongoing dizziness/unsteadiness for several months. Coming back to therapy after additional MRI and follow up with MD.  Non-Vestibular symptoms: changes in hearing and muffled hearing on R side  Type of dizziness: Imbalance (Disequilibrium), Unsteady with head/body turns, "Funny feeling in the head", and "Swimmyheaded"  Frequency: Daily   Duration: Constant  Aggravating factors: Induced by motion: looking up at the ceiling, turning body quickly, turning head quickly, and upon first standing   Relieving factors: slow movements  Progression of symptoms: unchanged  OCULOMOTOR EXAM:  Ocular Alignment: abnormal and L eye elevated in orbit  Ocular ROM:  R eye doesn't elevate as much as L eye  Spontaneous Nystagmus: absent  Gaze-Induced Nystagmus: absent  Smooth Pursuits: intact and pt reports diplopia with horizontal movements  Saccades: intact and slightly slower inferior/superior directions  Convergence/Divergence: 4-5 cm    VESTIBULAR - OCULAR REFLEX:   Slow VOR: Normal and Comment: c/o mild dizziness 3/10 in vertical directions  VOR Cancellation: Normal  Head-Impulse Test: HIT Right: positive HIT Left: negative    POSITIONAL TESTING: Right Dix-Hallpike: c/o minimal dizziness Left Dix-Hallpike: upbeating, left nystagmus, c/o dizziness, and Duration: 10 sec Right Roll Test: no nystagmus Left Roll Test: geotropic nystagmus, dizziness, and Duration: 15  MOTION SENSITIVITY:  Motion Sensitivity Quotient Intensity: 0 = none, 1 = Lightheaded, 2 = Mild, 3 = Moderate, 4 = Severe, 5 = Vomiting  Intensity  1. Sitting to supine   2. Supine to L side    3. Supine to R side   4. Supine to sitting   5. L Hallpike-Dix   6. Up from L    7. R Hallpike-Dix   8. Up from R    9. Sitting, head tipped to L knee 2  10. Head up from L knee 2  11. Sitting, head tipped to R knee 2  12. Head up from R knee 2 (slightly more than L)  13. Sitting head turns x5 2  14.Sitting head nods x5 2+  15. In stance, 180 turn to L  2+  16. In stance, 180 turn to R 2    OTHOSTATICS: not done   VESTIBULAR TREATMENT:  DATE: 04/04/2022  Canalith Repositioning:  BBQ Roll Left: Number of Reps: 1, Response to Treatment: symptoms improved, and Comment: mild unsteadiness upon return to sit     PATIENT EDUCATION: Education details: PT eval results, POC; rationale for canal repositioning and to continue VOR exercises from previous eval that he was doing. Person educated: Patient Education method: Explanation Education comprehension: verbalized understanding  HOME EXERCISE PROGRAM: Access Code: [email or web address] URL: [email or web address] Date: 01/14/2022 Prepared by: [facility]   Exercises - Seated Gaze Stabilization with Head Rotation  - 1 x daily - 5 x weekly - 2 sets - 30 sec hold - Seated Gaze Stabilization with Head Nod  - 1 x daily - 5 x weekly - 2 sets - 30 sec hold - Seated Horizontal Smooth Pursuit  - 1 x daily - 5 x weekly - 2 sets - 30 sec hold - Corner Balance Feet Together: Eyes Closed With Head Turns  - 1 x daily - 7 x weekly - 3 sets - 5 reps - Semi-Tandem Corner Balance With Eyes Closed  - 1 x daily - 7 x weekly - 3 sets - 15-30 sec hold  GOALS: Goals reviewed with patient? Yes  SHORT TERM GOALS: Target date: 05/02/2022  Patient to be independent with initial HEP.  Baseline: Goal status: On-going   LONG TERM GOALS: Target date: 05/16/2022  Patient to be independent with advanced HEP.   Baseline:  Goal status: IN PROGRESS  2.  Patient to report 0/10 dizziness with standing vertical and horizontal VOR for 30 seconds to improve functional mobility. Baseline: seated rating 3/10 dizziness Goal status: IN PROGRESS  3.  Patient will report 0/10 dizziness with bed mobility.  Baseline: symptomatic with dix-hallpike and horizontal roll to L Goal status: IN PROGRESS  4.  Pt will improve FOTO score to at least 63 for improved functional mobility. Baseline: 53 initial intake Goal status: IN PROGRESS   ASSESSMENT:  CLINICAL IMPRESSION: Patient arrived to session with report of no improvement in symptoms. Took time to educate patient on vestibular diagnoses that he has been given in the past and importance of continued HEP compliance for max benefit. Reviewed HEP which revealed most symptoms with vertical VOR, particularly with head turned R. Upon reviewing habituation, patient with nystagmus indicating R and L posterior canalithiasis. Treated with R Epley x2 which improved symptoms. Patient tolerated session well and without complaints upon leaving.   OBJECTIVE IMPAIRMENTS: Abnormal gait, decreased balance, and dizziness.   ACTIVITY LIMITATIONS: bending, standing, squatting, sleeping, transfers, bed mobility, and locomotion level  PARTICIPATION LIMITATIONS: cleaning, laundry, shopping, and community activity  PERSONAL FACTORS: 3+ comorbidities: see above  are also affecting patient's functional outcome.   REHAB POTENTIAL: Good  CLINICAL DECISION MAKING: Evolving/moderate complexity  EVALUATION COMPLEXITY: Moderate   PLAN:  PT FREQUENCY: 2x/week  PT DURATION: 6 weeks  PLANNED INTERVENTIONS: Therapeutic exercises, Therapeutic activity, Neuromuscular re-education, Balance training, Gait training, Patient/Family education, Self Care, Vestibular training, and Canalith repositioning  PLAN FOR NEXT SESSION: Reassess positional testing and treat as appropriate.  Habituation  for rolling, functional activities.  VOR standing. Compliant surface activities.  Review HEP.   Janene Harvey, PT, DPT 04/21/22 8:44 AM  [facility] at [facility] [address] [place], [place], [place] [address] Phone # [phone] Fax # [phone]

## 2022-04-21 ENCOUNTER — Ambulatory Visit: Payer: No Typology Code available for payment source | Admitting: Physical Therapy

## 2022-04-21 ENCOUNTER — Encounter: Payer: Self-pay | Admitting: Physical Therapy

## 2022-04-21 DIAGNOSIS — R42 Dizziness and giddiness: Secondary | ICD-10-CM | POA: Diagnosis not present

## 2022-04-21 DIAGNOSIS — R2681 Unsteadiness on feet: Secondary | ICD-10-CM

## 2022-04-28 ENCOUNTER — Encounter: Payer: No Typology Code available for payment source | Admitting: Physical Therapy

## 2022-05-02 ENCOUNTER — Ambulatory Visit: Payer: No Typology Code available for payment source | Admitting: Physical Therapy

## 2022-05-02 ENCOUNTER — Encounter: Payer: No Typology Code available for payment source | Admitting: Physical Therapy

## 2022-05-06 NOTE — Therapy (Signed)
OUTPATIENT PHYSICAL THERAPY VESTIBULAR TREATMENT NOTES     Patient Name: Blake Curtis MRN: [id number] DOB:September 04, 1960, 61 y.o., male Today's Date: 05/07/2022   PT End of Session - 05/07/22 0850     Visit Number 5    Number of Visits 13    Date for PT Re-Evaluation 05/16/22    Authorization Type [employer]    Authorization - Visit Number 5    Authorization - Number of Visits 35    PT Start Time 0800    PT Stop Time 0846    PT Time Calculation (min) 46 min    Activity Tolerance Patient tolerated treatment well    Behavior During Therapy [facility] for tasks assessed/performed                Past Medical History:  Diagnosis Date   Diabetes mellitus without complication ([place])    diet controlled   Hypertension    Sleep apnea    No CPAP   Past Surgical History:  Procedure Laterality Date   APPENDECTOMY  2002   TRANSURETHRAL RESECTION OF PROSTATE N/A 03/19/2020   Procedure: TRANSURETHRAL RESECTION OF THE PROSTATE (TURP) WITH CYSTOSCOPY;  Surgeon: Raynelle Bring, MD;  Location: [facility];  Service: Urology;  Laterality: N/A;  ANESTHESIA GENERAL OR SPINAL PER DR Alinda Money   Patient Active Problem List   Diagnosis Date Noted   Bladder outlet obstruction 03/19/2020   Sarcoidosis 03/30/2019   Dysphagia 03/30/2019   Prostate nodule 04/29/2013   Benign neoplasm of colon 04/29/2013    PCP: Burnard Bunting, MD REFERRING PROVIDER: Burnard Bunting, MD  REFERRING DIAG: H81.10 (ICD-10-CM) - Benign paroxysmal vertigo, unspecified ear  THERAPY DIAG:  Dizziness and giddiness  Unsteadiness on feet  Other abnormalities of gait and mobility  ONSET DATE: Mid-August  Rationale for Evaluation and Treatment: Rehabilitation  SUBJECTIVE:   SUBJECTIVE STATEMENT: Feeling like he is noticing more symptoms to the R side- rubbing head while in the shower, turning head to the R and nodding the head at the same time. Denies known cervical pathology or N/T.   Pt accompanied by:  self  PERTINENT HISTORY: dx of vestibular neuritis, see above PMH  PAIN:  Are you having pain? No  PRECAUTIONS: None  WEIGHT BEARING RESTRICTIONS: No  FALLS: Has patient fallen in last 6 months? No   PLOF: Independent  PATIENT GOALS: To feel better, get rid of dizziness feeling  OBJECTIVE:     TODAY'S TREATMENT: 05/07/22 Activity Comments  R sidelying test  R upbeating torsional nystagmus non fatiguing; dizziness upon sitting up and laying down  L sidelying test  Nonspecific nyatgmus vs saccadic intrusions lasting ~15 sec; much mild dizziness  R sidelying test  R upbeating torsional nystagmus non fatiguing; dizziness upon sitting up and laying down  Semont for R BPPV Pt with difficulty getting head in proper position; tolerated well.   Semont for R BPPV Improved positioning; tolerated well; report of "feel like something's moving" in the R ear  R sidelying test  R upbeating torsional nystagmus non fatiguing; dizziness upon sitting up and laying down  D2 flexion ball to cone on floor 10x Good speed; mild dizziness; slightly off balance to L diagonal  placing green pball on overhead rack 10x  C/o mild dizziness upon placing ball down in forward bent position   Palpation  Report of mild dizziness with pressure over R suboccipitals   Cervical manual distraction  Pt reported improvement in dizziness  Cervical retractions Good form  PATIENT EDUCATION: Education details: educated pt on posterior cupulolithiasis vs. Canalithiasis and its anatomy and recommended treatment; discussed possibility of sending patient back to MD if symptoms continue not to improve Person educated: Patient Education method: Explanation, Demonstration, Tactile cues, Verbal cues, and Handouts Education comprehension: verbalized understanding and returned demonstration    HOME EXERCISE PROGRAM Last updated: 05/07/22 Access Code: [email or web address] URL: [email or web address] Date:  05/07/2022 Prepared by: [facility]  Exercises - Seated Gaze Stabilization with Head Nod  - 1 x daily - 5 x weekly - 2 sets - 30 sec hold - Brandt-Daroff Vestibular Exercise  - 1 x daily - 7 x weekly - 1 sets - 3-5 reps - Standing Gaze Stabilization with Head Nod  - 1-2 x daily - 7 x weekly - 1 sets - 2 reps - 30 sec hold - Corner Balance Feet Together: Eyes Closed With Head Turns  - 1 x daily - 7 x weekly - 3 sets - 5 reps - Semi-Tandem Corner Balance With Eyes Closed  - 1 x daily - 7 x weekly - 3 sets - 15-30 sec hold - Right Liberatory Manuever for Posterior Canal BPPV  - 1 x daily - 5 x weekly - 2 reps - Cervical Retraction with Overpressure  - 1 x daily - 5 x weekly - 2 sets - 10 reps - 3 sec hold - Hip Hinge with Cone Pick-Up  - 1 x daily - 5 x weekly - 2 sets - 10 reps   ---------------------------------------------------------------------- DIAGNOSTIC FINDINGS: MRI of orbits unremarkable; MRI brain 12/2021  COGNITION: Overall cognitive status: Within functional limits for tasks assessed   PATIENT SURVEYS:  FOTO 53 at intake; 63 predicted for d/c  VESTIBULAR ASSESSMENT:  GENERAL OBSERVATION: No acute distress.  Does report sensation in R ear and dizziness when he reaches to touch head   SYMPTOM BEHAVIOR:  Subjective history: see above; ongoing dizziness/unsteadiness for several months. Coming back to therapy after additional MRI and follow up with MD.  Non-Vestibular symptoms: changes in hearing and muffled hearing on R side  Type of dizziness: Imbalance (Disequilibrium), Unsteady with head/body turns, "Funny feeling in the head", and "Swimmyheaded"  Frequency: Daily   Duration: Constant  Aggravating factors: Induced by motion: looking up at the ceiling, turning body quickly, turning head quickly, and upon first standing   Relieving factors: slow movements  Progression of symptoms: unchanged  OCULOMOTOR EXAM:  Ocular Alignment: abnormal  and L eye elevated in orbit  Ocular ROM:  R eye doesn't elevate as much as L eye  Spontaneous Nystagmus: absent  Gaze-Induced Nystagmus: absent  Smooth Pursuits: intact and pt reports diplopia with horizontal movements  Saccades: intact and slightly slower inferior/superior directions  Convergence/Divergence: 4-5 cm    VESTIBULAR - OCULAR REFLEX:   Slow VOR: Normal and Comment: c/o mild dizziness 3/10 in vertical directions  VOR Cancellation: Normal  Head-Impulse Test: HIT Right: positive HIT Left: negative    POSITIONAL TESTING: Right Dix-Hallpike: c/o minimal dizziness Left Dix-Hallpike: upbeating, left nystagmus, c/o dizziness, and Duration: 10 sec Right Roll Test: no nystagmus Left Roll Test: geotropic nystagmus, dizziness, and Duration: 15  MOTION SENSITIVITY:  Motion Sensitivity Quotient Intensity: 0 = none, 1 = Lightheaded, 2 = Mild, 3 = Moderate, 4 = Severe, 5 = Vomiting  Intensity  1. Sitting to supine   2. Supine to L side   3. Supine to R side   4. Supine to sitting   5. L  Hallpike-Dix   6. Up from L    7. R Hallpike-Dix   8. Up from R    9. Sitting, head tipped to L knee 2  10. Head up from L knee 2  11. Sitting, head tipped to R knee 2  12. Head up from R knee 2 (slightly more than L)  13. Sitting head turns x5 2  14.Sitting head nods x5 2+  15. In stance, 180 turn to L  2+  16. In stance, 180 turn to R 2    OTHOSTATICS: not done   VESTIBULAR TREATMENT:                                                                                                   DATE: 04/04/2022  Canalith Repositioning:  BBQ Roll Left: Number of Reps: 1, Response to Treatment: symptoms improved, and Comment: mild unsteadiness upon return to sit     PATIENT EDUCATION: Education details: PT eval results, POC; rationale for canal repositioning and to continue VOR exercises from previous eval that he was doing. Person educated: Patient Education method: Explanation Education  comprehension: verbalized understanding  HOME EXERCISE PROGRAM: Access Code: [email or web address] URL: [email or web address] Date: 01/14/2022 Prepared by: [facility]   Exercises - Seated Gaze Stabilization with Head Rotation  - 1 x daily - 5 x weekly - 2 sets - 30 sec hold - Seated Gaze Stabilization with Head Nod  - 1 x daily - 5 x weekly - 2 sets - 30 sec hold - Seated Horizontal Smooth Pursuit  - 1 x daily - 5 x weekly - 2 sets - 30 sec hold - Corner Balance Feet Together: Eyes Closed With Head Turns  - 1 x daily - 7 x weekly - 3 sets - 5 reps - Semi-Tandem Corner Balance With Eyes Closed  - 1 x daily - 7 x weekly - 3 sets - 15-30 sec hold  GOALS: Goals reviewed with patient? Yes  SHORT TERM GOALS: Target date: 05/02/2022  Patient to be independent with initial HEP.  Baseline: Goal status: On-going   LONG TERM GOALS: Target date: 05/16/2022  Patient to be independent with advanced HEP.  Baseline:  Goal status: IN PROGRESS  2.  Patient to report 0/10 dizziness with standing vertical and horizontal VOR for 30 seconds to improve functional mobility. Baseline: seated rating 3/10 dizziness Goal status: IN PROGRESS  3.  Patient will report 0/10 dizziness with bed mobility.  Baseline: symptomatic with dix-hallpike and horizontal roll to L Goal status: IN PROGRESS  4.  Pt will improve FOTO score to at least 63 for improved functional mobility. Baseline: 53 initial intake Goal status: IN PROGRESS   ASSESSMENT:  CLINICAL IMPRESSION: Patient arrived to session with report of increased intensity of symptoms to the R side. Positional testing indicated possible R posterior Cupulolithiasis, treated with R semont x2. Symptoms did not seem to improve, thus after detailed education provided semont to be done at home with supervision. Proceeded with habituation activities which brought on mild symptoms. Patient reports dizziness when rubbing his  scalp in the shower; reported reproduction of dizziness with palpation over R suboccipitals. Improved with distraction. Updated HEP with cervical reaction to hopefully address at home. Patient reported understanding of all edu provided and without complaints at end of session.    OBJECTIVE IMPAIRMENTS: Abnormal gait, decreased balance, and dizziness.   ACTIVITY LIMITATIONS: bending, standing, squatting, sleeping, transfers, bed mobility, and locomotion level  PARTICIPATION LIMITATIONS: cleaning, laundry, shopping, and community activity  PERSONAL FACTORS: 3+ comorbidities: see above  are also affecting patient's functional outcome.   REHAB POTENTIAL: Good  CLINICAL DECISION MAKING: Evolving/moderate complexity  EVALUATION COMPLEXITY: Moderate   PLAN:  PT FREQUENCY: 2x/week  PT DURATION: 6 weeks  PLANNED INTERVENTIONS: Therapeutic exercises, Therapeutic activity, Neuromuscular re-education, Balance training, Gait training, Patient/Family education, Self Care, Vestibular training, and Canalith repositioning  PLAN FOR NEXT SESSION: Retest R sidelying test;  Habituation for rolling, functional activities.  VOR standing. Compliant surface activities.  Review HEP.   Janene Harvey, PT, DPT 05/07/22 8:56 AM  [facility] [address], [place], [place] [address] Phone # [phone] Fax # [phone]

## 2022-05-07 ENCOUNTER — Ambulatory Visit: Payer: No Typology Code available for payment source | Attending: Emergency Medicine | Admitting: Physical Therapy

## 2022-05-07 ENCOUNTER — Encounter: Payer: Self-pay | Admitting: Physical Therapy

## 2022-05-07 DIAGNOSIS — R2689 Other abnormalities of gait and mobility: Secondary | ICD-10-CM | POA: Diagnosis present

## 2022-05-07 DIAGNOSIS — R42 Dizziness and giddiness: Secondary | ICD-10-CM | POA: Diagnosis present

## 2022-05-07 DIAGNOSIS — R2681 Unsteadiness on feet: Secondary | ICD-10-CM | POA: Insufficient documentation

## 2022-05-13 NOTE — Therapy (Incomplete)
OUTPATIENT PHYSICAL THERAPY VESTIBULAR TREATMENT NOTES     Patient Name: Blake Curtis MRN: [id number] DOB:1961-03-19, 61 y.o., male Today's Date: 05/13/2022        Past Medical History:  Diagnosis Date   Diabetes mellitus without complication ([place])    diet controlled   Hypertension    Sleep apnea    No CPAP   Past Surgical History:  Procedure Laterality Date   APPENDECTOMY  2002   TRANSURETHRAL RESECTION OF PROSTATE N/A 03/19/2020   Procedure: TRANSURETHRAL RESECTION OF THE PROSTATE (TURP) WITH CYSTOSCOPY;  Surgeon: Raynelle Bring, MD;  Location: [facility];  Service: Urology;  Laterality: N/A;  ANESTHESIA GENERAL OR SPINAL PER DR Alinda Money   Patient Active Problem List   Diagnosis Date Noted   Bladder outlet obstruction 03/19/2020   Sarcoidosis 03/30/2019   Dysphagia 03/30/2019   Prostate nodule 04/29/2013   Benign neoplasm of colon 04/29/2013    PCP: Burnard Bunting, MD REFERRING PROVIDER: Burnard Bunting, MD  REFERRING DIAG: H81.10 (ICD-10-CM) - Benign paroxysmal vertigo, unspecified ear  THERAPY DIAG:  No diagnosis found.  ONSET DATE: Mid-August  Rationale for Evaluation and Treatment: Rehabilitation  SUBJECTIVE:   SUBJECTIVE STATEMENT: Feeling like he is noticing more symptoms to the R side- rubbing head while in the shower, turning head to the R and nodding the head at the same time. Denies known cervical pathology or N/T.   Pt accompanied by: self  PERTINENT HISTORY: dx of vestibular neuritis, see above PMH  PAIN:  Are you having pain? No  PRECAUTIONS: None  WEIGHT BEARING RESTRICTIONS: No  FALLS: Has patient fallen in last 6 months? No   PLOF: Independent  PATIENT GOALS: To feel better, get rid of dizziness feeling  OBJECTIVE:     TODAY'S TREATMENT: 05/14/22 Activity Comments                        HOME EXERCISE PROGRAM Last updated: 05/07/22 Access Code: [email or web address] URL: [email or web address] Date:  05/07/2022 Prepared by: [facility]  Exercises - Seated Gaze Stabilization with Head Nod  - 1 x daily - 5 x weekly - 2 sets - 30 sec hold - Brandt-Daroff Vestibular Exercise  - 1 x daily - 7 x weekly - 1 sets - 3-5 reps - Standing Gaze Stabilization with Head Nod  - 1-2 x daily - 7 x weekly - 1 sets - 2 reps - 30 sec hold - Corner Balance Feet Together: Eyes Closed With Head Turns  - 1 x daily - 7 x weekly - 3 sets - 5 reps - Semi-Tandem Corner Balance With Eyes Closed  - 1 x daily - 7 x weekly - 3 sets - 15-30 sec hold - Right Liberatory Manuever for Posterior Canal BPPV  - 1 x daily - 5 x weekly - 2 reps - Cervical Retraction with Overpressure  - 1 x daily - 5 x weekly - 2 sets - 10 reps - 3 sec hold - Hip Hinge with Cone Pick-Up  - 1 x daily - 5 x weekly - 2 sets - 10 reps   ---------------------------------------------------------------------- DIAGNOSTIC FINDINGS: MRI of orbits unremarkable; MRI brain 12/2021  COGNITION: Overall cognitive status: Within functional limits for tasks assessed   PATIENT SURVEYS:  FOTO 53 at intake; 63 predicted for d/c  VESTIBULAR ASSESSMENT:  GENERAL OBSERVATION: No acute distress.  Does report sensation in R ear and dizziness when he reaches to touch head   SYMPTOM BEHAVIOR:  Subjective history: see above; ongoing dizziness/unsteadiness for several months. Coming back to therapy after additional MRI and follow up with MD.  Non-Vestibular symptoms: changes in hearing and muffled hearing on R side  Type of dizziness: Imbalance (Disequilibrium), Unsteady with head/body turns, "Funny feeling in the head", and "Swimmyheaded"  Frequency: Daily   Duration: Constant  Aggravating factors: Induced by motion: looking up at the ceiling, turning body quickly, turning head quickly, and upon first standing   Relieving factors: slow movements  Progression of symptoms: unchanged  OCULOMOTOR EXAM:  Ocular Alignment: abnormal  and L eye elevated in orbit  Ocular ROM:  R eye doesn't elevate as much as L eye  Spontaneous Nystagmus: absent  Gaze-Induced Nystagmus: absent  Smooth Pursuits: intact and pt reports diplopia with horizontal movements  Saccades: intact and slightly slower inferior/superior directions  Convergence/Divergence: 4-5 cm    VESTIBULAR - OCULAR REFLEX:   Slow VOR: Normal and Comment: c/o mild dizziness 3/10 in vertical directions  VOR Cancellation: Normal  Head-Impulse Test: HIT Right: positive HIT Left: negative    POSITIONAL TESTING: Right Dix-Hallpike: c/o minimal dizziness Left Dix-Hallpike: upbeating, left nystagmus, c/o dizziness, and Duration: 10 sec Right Roll Test: no nystagmus Left Roll Test: geotropic nystagmus, dizziness, and Duration: 15  MOTION SENSITIVITY:  Motion Sensitivity Quotient Intensity: 0 = none, 1 = Lightheaded, 2 = Mild, 3 = Moderate, 4 = Severe, 5 = Vomiting  Intensity  1. Sitting to supine   2. Supine to L side   3. Supine to R side   4. Supine to sitting   5. L Hallpike-Dix   6. Up from L    7. R Hallpike-Dix   8. Up from R    9. Sitting, head tipped to L knee 2  10. Head up from L knee 2  11. Sitting, head tipped to R knee 2  12. Head up from R knee 2 (slightly more than L)  13. Sitting head turns x5 2  14.Sitting head nods x5 2+  15. In stance, 180 turn to L  2+  16. In stance, 180 turn to R 2    OTHOSTATICS: not done   VESTIBULAR TREATMENT:                                                                                                   DATE: 04/04/2022  Canalith Repositioning:  BBQ Roll Left: Number of Reps: 1, Response to Treatment: symptoms improved, and Comment: mild unsteadiness upon return to sit     PATIENT EDUCATION: Education details: PT eval results, POC; rationale for canal repositioning and to continue VOR exercises from previous eval that he was doing. Person educated: Patient Education method: Explanation Education  comprehension: verbalized understanding  HOME EXERCISE PROGRAM: Access Code: [email or web address] URL: [email or web address] Date: 01/14/2022 Prepared by: [facility]   Exercises - Seated Gaze Stabilization with Head Rotation  - 1 x daily - 5 x weekly - 2 sets - 30 sec hold - Seated Gaze Stabilization with Head Nod  - 1 x daily - 5 x  weekly - 2 sets - 30 sec hold - Seated Horizontal Smooth Pursuit  - 1 x daily - 5 x weekly - 2 sets - 30 sec hold - Corner Balance Feet Together: Eyes Closed With Head Turns  - 1 x daily - 7 x weekly - 3 sets - 5 reps - Semi-Tandem Corner Balance With Eyes Closed  - 1 x daily - 7 x weekly - 3 sets - 15-30 sec hold  GOALS: Goals reviewed with patient? Yes  SHORT TERM GOALS: Target date: 05/02/2022  Patient to be independent with initial HEP.  Baseline: Goal status: On-going   LONG TERM GOALS: Target date: 05/16/2022  Patient to be independent with advanced HEP.  Baseline:  Goal status: IN PROGRESS  2.  Patient to report 0/10 dizziness with standing vertical and horizontal VOR for 30 seconds to improve functional mobility. Baseline: seated rating 3/10 dizziness Goal status: IN PROGRESS  3.  Patient will report 0/10 dizziness with bed mobility.  Baseline: symptomatic with dix-hallpike and horizontal roll to L Goal status: IN PROGRESS  4.  Pt will improve FOTO score to at least 63 for improved functional mobility. Baseline: 53 initial intake Goal status: IN PROGRESS   ASSESSMENT:  CLINICAL IMPRESSION: Patient arrived to session with report of increased intensity of symptoms to the R side. Positional testing indicated possible R posterior Cupulolithiasis, treated with R semont x2. Symptoms did not seem to improve, thus after detailed education provided semont to be done at home with supervision. Proceeded with habituation activities which brought on mild symptoms. Patient reports dizziness when rubbing his  scalp in the shower; reported reproduction of dizziness with palpation over R suboccipitals. Improved with distraction. Updated HEP with cervical reaction to hopefully address at home. Patient reported understanding of all edu provided and without complaints at end of session.    OBJECTIVE IMPAIRMENTS: Abnormal gait, decreased balance, and dizziness.   ACTIVITY LIMITATIONS: bending, standing, squatting, sleeping, transfers, bed mobility, and locomotion level  PARTICIPATION LIMITATIONS: cleaning, laundry, shopping, and community activity  PERSONAL FACTORS: 3+ comorbidities: see above  are also affecting patient's functional outcome.   REHAB POTENTIAL: Good  CLINICAL DECISION MAKING: Evolving/moderate complexity  EVALUATION COMPLEXITY: Moderate   PLAN:  PT FREQUENCY: 2x/week  PT DURATION: 6 weeks  PLANNED INTERVENTIONS: Therapeutic exercises, Therapeutic activity, Neuromuscular re-education, Balance training, Gait training, Patient/Family education, Self Care, Vestibular training, and Canalith repositioning  PLAN FOR NEXT SESSION: Retest R sidelying test;  Habituation for rolling, functional activities.  VOR standing. Compliant surface activities.  Review HEP.   Janene Harvey, PT, DPT 05/13/22 8:35 AM  [facility] [address] [place], [place], [place] [address] Phone # [phone] Fax # [phone]

## 2022-05-14 ENCOUNTER — Ambulatory Visit: Payer: No Typology Code available for payment source | Admitting: Physical Therapy

## 2022-05-14 NOTE — Therapy (Signed)
OUTPATIENT PHYSICAL THERAPY VESTIBULAR TREATMENT NOTES     Patient Name: Blake Curtis MRN: [id number] DOB:[birth year], 61 y.o., male Today's Date: 05/14/2022        Past Medical History:  Diagnosis Date   Diabetes mellitus without complication ([place])    diet controlled   Hypertension    Sleep apnea    No CPAP   Past Surgical History:  Procedure Laterality Date   APPENDECTOMY  2002   TRANSURETHRAL RESECTION OF PROSTATE N/A 03/19/2020   Procedure: TRANSURETHRAL RESECTION OF THE PROSTATE (TURP) WITH CYSTOSCOPY;  Surgeon: Raynelle Bring, MD;  Location: [facility];  Service: Urology;  Laterality: N/A;  ANESTHESIA GENERAL OR SPINAL PER DR Alinda Money   Patient Active Problem List   Diagnosis Date Noted   Bladder outlet obstruction 03/19/2020   Sarcoidosis 03/30/2019   Dysphagia 03/30/2019   Prostate nodule 04/29/2013   Benign neoplasm of colon 04/29/2013    PCP: Burnard Bunting, MD REFERRING PROVIDER: Burnard Bunting, MD  REFERRING DIAG: H81.10 (ICD-10-CM) - Benign paroxysmal vertigo, unspecified ear  THERAPY DIAG:  No diagnosis found.  ONSET DATE: Mid-August  Rationale for Evaluation and Treatment: Rehabilitation  SUBJECTIVE:   SUBJECTIVE STATEMENT: Feeling like he is noticing more symptoms to the R side- rubbing head while in the shower, turning head to the R and nodding the head at the same time. Denies known cervical pathology or N/T.   Pt accompanied by: self  PERTINENT HISTORY: dx of vestibular neuritis, see above PMH  PAIN:  Are you having pain? No  PRECAUTIONS: None  WEIGHT BEARING RESTRICTIONS: No  FALLS: Has patient fallen in last 6 months? No   PLOF: Independent  PATIENT GOALS: To feel better, get rid of dizziness feeling  OBJECTIVE:     TODAY'S TREATMENT: 05/15/22 Activity Comments                        HOME EXERCISE PROGRAM Last updated: 05/07/22 Access Code: [email or web address] URL: [email or web address] Date:  05/07/2022 Prepared by: [facility]  Exercises - Seated Gaze Stabilization with Head Nod  - 1 x daily - 5 x weekly - 2 sets - 30 sec hold - Brandt-Daroff Vestibular Exercise  - 1 x daily - 7 x weekly - 1 sets - 3-5 reps - Standing Gaze Stabilization with Head Nod  - 1-2 x daily - 7 x weekly - 1 sets - 2 reps - 30 sec hold - Corner Balance Feet Together: Eyes Closed With Head Turns  - 1 x daily - 7 x weekly - 3 sets - 5 reps - Semi-Tandem Corner Balance With Eyes Closed  - 1 x daily - 7 x weekly - 3 sets - 15-30 sec hold - Right Liberatory Manuever for Posterior Canal BPPV  - 1 x daily - 5 x weekly - 2 reps - Cervical Retraction with Overpressure  - 1 x daily - 5 x weekly - 2 sets - 10 reps - 3 sec hold - Hip Hinge with Cone Pick-Up  - 1 x daily - 5 x weekly - 2 sets - 10 reps   ---------------------------------------------------------------------- DIAGNOSTIC FINDINGS: MRI of orbits unremarkable; MRI brain 12/2021  COGNITION: Overall cognitive status: Within functional limits for tasks assessed   PATIENT SURVEYS:  FOTO 53 at intake; 63 predicted for d/c  VESTIBULAR ASSESSMENT:  GENERAL OBSERVATION: No acute distress.  Does report sensation in R ear and dizziness when he reaches to touch head   SYMPTOM BEHAVIOR:  Subjective history: see above; ongoing dizziness/unsteadiness for several months. Coming back to therapy after additional MRI and follow up with MD.  Non-Vestibular symptoms: changes in hearing and muffled hearing on R side  Type of dizziness: Imbalance (Disequilibrium), Unsteady with head/body turns, "Funny feeling in the head", and "Swimmyheaded"  Frequency: Daily   Duration: Constant  Aggravating factors: Induced by motion: looking up at the ceiling, turning body quickly, turning head quickly, and upon first standing   Relieving factors: slow movements  Progression of symptoms: unchanged  OCULOMOTOR EXAM:  Ocular Alignment: abnormal  and L eye elevated in orbit  Ocular ROM:  R eye doesn't elevate as much as L eye  Spontaneous Nystagmus: absent  Gaze-Induced Nystagmus: absent  Smooth Pursuits: intact and pt reports diplopia with horizontal movements  Saccades: intact and slightly slower inferior/superior directions  Convergence/Divergence: 4-5 cm    VESTIBULAR - OCULAR REFLEX:   Slow VOR: Normal and Comment: c/o mild dizziness 3/10 in vertical directions  VOR Cancellation: Normal  Head-Impulse Test: HIT Right: positive HIT Left: negative    POSITIONAL TESTING: Right Dix-Hallpike: c/o minimal dizziness Left Dix-Hallpike: upbeating, left nystagmus, c/o dizziness, and Duration: 10 sec Right Roll Test: no nystagmus Left Roll Test: geotropic nystagmus, dizziness, and Duration: 15  MOTION SENSITIVITY:  Motion Sensitivity Quotient Intensity: 0 = none, 1 = Lightheaded, 2 = Mild, 3 = Moderate, 4 = Severe, 5 = Vomiting  Intensity  1. Sitting to supine   2. Supine to L side   3. Supine to R side   4. Supine to sitting   5. L Hallpike-Dix   6. Up from L    7. R Hallpike-Dix   8. Up from R    9. Sitting, head tipped to L knee 2  10. Head up from L knee 2  11. Sitting, head tipped to R knee 2  12. Head up from R knee 2 (slightly more than L)  13. Sitting head turns x5 2  14.Sitting head nods x5 2+  15. In stance, 180 turn to L  2+  16. In stance, 180 turn to R 2    OTHOSTATICS: not done   VESTIBULAR TREATMENT:                                                                                                   DATE: 04/04/2022  Canalith Repositioning:  BBQ Roll Left: Number of Reps: 1, Response to Treatment: symptoms improved, and Comment: mild unsteadiness upon return to sit     PATIENT EDUCATION: Education details: PT eval results, POC; rationale for canal repositioning and to continue VOR exercises from previous eval that he was doing. Person educated: Patient Education method: Explanation Education  comprehension: verbalized understanding  HOME EXERCISE PROGRAM: Access Code: [email or web address] URL: [email or web address] Date: 01/14/2022 Prepared by: [facility]   Exercises - Seated Gaze Stabilization with Head Rotation  - 1 x daily - 5 x weekly - 2 sets - 30 sec hold - Seated Gaze Stabilization with Head Nod  - 1 x daily - 5 x  weekly - 2 sets - 30 sec hold - Seated Horizontal Smooth Pursuit  - 1 x daily - 5 x weekly - 2 sets - 30 sec hold - Corner Balance Feet Together: Eyes Closed With Head Turns  - 1 x daily - 7 x weekly - 3 sets - 5 reps - Semi-Tandem Corner Balance With Eyes Closed  - 1 x daily - 7 x weekly - 3 sets - 15-30 sec hold  GOALS: Goals reviewed with patient? Yes  SHORT TERM GOALS: Target date: 05/02/2022  Patient to be independent with initial HEP.  Baseline: Goal status: On-going   LONG TERM GOALS: Target date: 05/16/2022  Patient to be independent with advanced HEP.  Baseline:  Goal status: IN PROGRESS  2.  Patient to report 0/10 dizziness with standing vertical and horizontal VOR for 30 seconds to improve functional mobility. Baseline: seated rating 3/10 dizziness Goal status: IN PROGRESS  3.  Patient will report 0/10 dizziness with bed mobility.  Baseline: symptomatic with dix-hallpike and horizontal roll to L Goal status: IN PROGRESS  4.  Pt will improve FOTO score to at least 63 for improved functional mobility. Baseline: 53 initial intake Goal status: IN PROGRESS   ASSESSMENT:  CLINICAL IMPRESSION: Patient arrived to session with report of increased intensity of symptoms to the R side. Positional testing indicated possible R posterior Cupulolithiasis, treated with R semont x2. Symptoms did not seem to improve, thus after detailed education provided semont to be done at home with supervision. Proceeded with habituation activities which brought on mild symptoms. Patient reports dizziness when rubbing his  scalp in the shower; reported reproduction of dizziness with palpation over R suboccipitals. Improved with distraction. Updated HEP with cervical reaction to hopefully address at home. Patient reported understanding of all edu provided and without complaints at end of session.    OBJECTIVE IMPAIRMENTS: Abnormal gait, decreased balance, and dizziness.   ACTIVITY LIMITATIONS: bending, standing, squatting, sleeping, transfers, bed mobility, and locomotion level  PARTICIPATION LIMITATIONS: cleaning, laundry, shopping, and community activity  PERSONAL FACTORS: 3+ comorbidities: see above  are also affecting patient's functional outcome.   REHAB POTENTIAL: Good  CLINICAL DECISION MAKING: Evolving/moderate complexity  EVALUATION COMPLEXITY: Moderate   PLAN:  PT FREQUENCY: 2x/week  PT DURATION: 6 weeks  PLANNED INTERVENTIONS: Therapeutic exercises, Therapeutic activity, Neuromuscular re-education, Balance training, Gait training, Patient/Family education, Self Care, Vestibular training, and Canalith repositioning  PLAN FOR NEXT SESSION: Retest R sidelying test;  Habituation for rolling, functional activities.  VOR standing. Compliant surface activities.  Review HEP.   Janene Harvey, PT, DPT 05/14/22 2:23 PM  [facility] [address] [place], [place], [place]  [address] Phone # [phone] Fax # [phone]

## 2022-05-15 ENCOUNTER — Encounter: Payer: Self-pay | Admitting: Physical Therapy

## 2022-05-15 ENCOUNTER — Ambulatory Visit: Payer: No Typology Code available for payment source | Admitting: Physical Therapy

## 2022-05-15 DIAGNOSIS — R42 Dizziness and giddiness: Secondary | ICD-10-CM

## 2022-05-15 DIAGNOSIS — R2681 Unsteadiness on feet: Secondary | ICD-10-CM

## 2022-05-15 DIAGNOSIS — R2689 Other abnormalities of gait and mobility: Secondary | ICD-10-CM

## 2022-06-04 DIAGNOSIS — E119 Type 2 diabetes mellitus without complications: Secondary | ICD-10-CM | POA: Diagnosis not present

## 2022-07-30 ENCOUNTER — Other Ambulatory Visit (HOSPITAL_COMMUNITY): Payer: Self-pay

## 2022-08-26 DIAGNOSIS — R82998 Other abnormal findings in urine: Secondary | ICD-10-CM | POA: Diagnosis not present

## 2022-08-26 DIAGNOSIS — I10 Essential (primary) hypertension: Secondary | ICD-10-CM | POA: Diagnosis not present

## 2022-08-26 DIAGNOSIS — R7989 Other specified abnormal findings of blood chemistry: Secondary | ICD-10-CM | POA: Diagnosis not present

## 2022-08-26 DIAGNOSIS — Z1212 Encounter for screening for malignant neoplasm of rectum: Secondary | ICD-10-CM | POA: Diagnosis not present

## 2022-08-26 DIAGNOSIS — E1169 Type 2 diabetes mellitus with other specified complication: Secondary | ICD-10-CM | POA: Diagnosis not present

## 2022-08-26 DIAGNOSIS — Z125 Encounter for screening for malignant neoplasm of prostate: Secondary | ICD-10-CM | POA: Diagnosis not present

## 2022-09-01 ENCOUNTER — Other Ambulatory Visit (HOSPITAL_COMMUNITY): Payer: Self-pay

## 2022-09-01 DIAGNOSIS — Z0000 Encounter for general adult medical examination without abnormal findings: Secondary | ICD-10-CM | POA: Diagnosis not present

## 2022-09-01 DIAGNOSIS — Z1331 Encounter for screening for depression: Secondary | ICD-10-CM | POA: Diagnosis not present

## 2022-09-01 DIAGNOSIS — E669 Obesity, unspecified: Secondary | ICD-10-CM | POA: Diagnosis not present

## 2022-09-01 DIAGNOSIS — N401 Enlarged prostate with lower urinary tract symptoms: Secondary | ICD-10-CM | POA: Diagnosis not present

## 2022-09-01 DIAGNOSIS — H8190 Unspecified disorder of vestibular function, unspecified ear: Secondary | ICD-10-CM | POA: Diagnosis not present

## 2022-09-01 DIAGNOSIS — I10 Essential (primary) hypertension: Secondary | ICD-10-CM | POA: Diagnosis not present

## 2022-09-01 DIAGNOSIS — E1169 Type 2 diabetes mellitus with other specified complication: Secondary | ICD-10-CM | POA: Diagnosis not present

## 2022-10-22 ENCOUNTER — Ambulatory Visit (HOSPITAL_COMMUNITY)
Admission: RE | Admit: 2022-10-22 | Discharge: 2022-10-22 | Disposition: A | Discharge status: Home / Self Care | Payer: 59 | Source: Ambulatory Visit | Attending: Internal Medicine | Admitting: Internal Medicine

## 2022-10-22 ENCOUNTER — Other Ambulatory Visit (HOSPITAL_COMMUNITY): Payer: Self-pay | Admitting: Internal Medicine

## 2022-10-22 DIAGNOSIS — R519 Headache, unspecified: Secondary | ICD-10-CM | POA: Diagnosis not present

## 2022-10-22 DIAGNOSIS — R42 Dizziness and giddiness: Secondary | ICD-10-CM | POA: Diagnosis not present

## 2022-10-22 DIAGNOSIS — E1169 Type 2 diabetes mellitus with other specified complication: Secondary | ICD-10-CM | POA: Diagnosis not present

## 2022-10-22 DIAGNOSIS — R2990 Unspecified symptoms and signs involving the nervous system: Secondary | ICD-10-CM | POA: Diagnosis not present

## 2022-10-22 DIAGNOSIS — I10 Essential (primary) hypertension: Secondary | ICD-10-CM | POA: Diagnosis not present

## 2022-10-22 MED ORDER — GADOBUTROL 1 MMOL/ML IV SOLN
10.0000 mL | Freq: Once | INTRAVENOUS | Status: AC | PRN
  Administered 2022-10-22: 10 mL via INTRAVENOUS

## 2022-11-03 ENCOUNTER — Ambulatory Visit: Payer: 59 | Admitting: Neurology

## 2022-11-03 ENCOUNTER — Encounter: Payer: Self-pay | Admitting: Neurology

## 2022-11-03 VITALS — BP 124/70 | HR 69 | Ht 74.0 in | Wt 264.0 lb

## 2022-11-03 DIAGNOSIS — H8121 Vestibular neuronitis, right ear: Secondary | ICD-10-CM

## 2022-11-03 DIAGNOSIS — H6991 Unspecified Eustachian tube disorder, right ear: Secondary | ICD-10-CM

## 2022-11-03 DIAGNOSIS — G3184 Mild cognitive impairment, so stated: Secondary | ICD-10-CM | POA: Diagnosis not present

## 2022-11-03 NOTE — Progress Notes (Signed)
[facility]  PATIENT: Blake Curtis DOB: 05/17/1961  REQUESTING CLINICIAN: Geoffry Paradise, MD HISTORY FROM: Patient and spouse  REASON FOR VISIT: New onset dizziness    HISTORICAL  CHIEF COMPLAINT:  Chief Complaint  Patient presents with   Follow-up    Rm13, wife present, fatigued, memory loss mmse:20, dizziness/vertigo, muffled sounds in right ear, bilateral foot numbness, when he rubs back of head feels dizzy    INTERVAL HISTORY 11/03/2022:  Patient presents today for follow-up, last visit was in August.  Since then he did follow-up with ENT, was diagnosed with right eustachian tube dysfunction and vestibular neuritis.  He reports the dizziness is still present and still constant, it does not stop him from doing what he needs to do.  Denies any falls, denies any vertigo-like sensation. Wife is reporting worsening memory and personality changes.  Since being diagnosed with COVID in 2021, initially she noticed some brain fog and then worsening memory.  Wife reports that patient sometimes forgets places that they went together.  He forgets how to get to the store that he has been numerous time and he needs to ask wife for direction. He does not remember how his wife moved to [place].  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  He denies forgetting family members names but they are reports that he will forget name of people that he has been working for the past 30 years.  Wife reported his personality has definitely changed, he is more irritable, frustrated and upset.  Initially she thought it was secondary to depression due to his continues dizziness/vertigo but this is getting worse and it worries her. He is also forgetful.     HISTORY OF PRESENT ILLNESS:  This is a 62 year old gentleman past medical history of diabetes, hypertension,  hyperlipidemia who is presenting with persistent dizziness since Sunday.  Patient reports that he was at church on Sunday and had a sudden onset of dizziness that he describes as head spinning inside.  He reported initial symptom was so severe that he had nausea and vomiting.  He went to the ED had a full work-up including MRI brain which was negative for any acute stroke.  Patient was discharged home with meclizine but stated that his symptoms are still present.  He denies any room spinning sensation but feels like his head is spinning inside.  His gait is abnormal, reported he is leaning to the left when walking denies any falls.  Never had this episode in the past.     OTHER MEDICAL CONDITIONS: Hypertension, Hyperlipidemia and DMII   REVIEW OF SYSTEMS: Full 14 system review of systems performed and negative with exception of: As noted in the HPI   ALLERGIES: Allergies  Allergen Reactions   Losartan Cough    HOME MEDICATIONS: Outpatient Medications Prior to Visit  Medication Sig Dispense Refill   aspirin EC 81 MG tablet Take 81 mg by mouth daily. Swallow whole.     metFORMIN (GLUCOPHAGE) 500 MG tablet Take 1 tablet (500 mg total) by mouth daily with meal. 90 tablet 3   aspirin EC 81 MG tablet Take 81 mg by mouth daily. Swallow whole.     atorvastatin (LIPITOR) 40 MG tablet Take 1 tablet (40 mg total) by mouth daily. (Patient not taking: Reported on 04/04/2022) 90 tablet 3   diazepam (VALIUM) 2 MG tablet Take 1  tablet (2 mg total) by mouth every 6 (six) hours as needed for anxiety. (Patient not taking: Reported on 04/04/2022) 7 tablet 0   famotidine (PEPCID) 20 MG tablet Take 1 tablet by mouth twice daily (Patient not taking: Reported on 04/04/2022) 60 tablet 1   meclizine (ANTIVERT) 25 MG tablet Take 1 tablet (25 mg total) by mouth 3 (three) times daily as needed for dizziness. (Patient not taking: Reported on 04/04/2022) 30 tablet 0   olmesartan (BENICAR) 20 MG tablet Take 1 tablet (20 mg  total) by mouth daily. (Patient not taking: Reported on 04/04/2022) 90 tablet 3   predniSONE (DELTASONE) 20 MG tablet 1 tablet Orally Once a day 5 days (Patient not taking: Reported on 04/04/2022) 5 tablet 0   predniSONE (DELTASONE) 5 MG tablet Take as directed on attached sheet. (Patient not taking: Reported on 04/04/2022) 21 tablet 0   sildenafil (REVATIO) 20 MG tablet Take 2-5 tablets (40-100 mg total) by mouth daily as needed 30-60 minutes prior to planned activity. (Patient not taking: Reported on 04/04/2022) 30 tablet 11   No facility-administered medications prior to visit.    PAST MEDICAL HISTORY: Past Medical History:  Diagnosis Date   Diabetes mellitus without complication (HCC)    diet controlled   Hypertension    Sleep apnea    No CPAP    PAST SURGICAL HISTORY: Past Surgical History:  Procedure Laterality Date   APPENDECTOMY  2002   TRANSURETHRAL RESECTION OF PROSTATE N/A 03/19/2020   Procedure: TRANSURETHRAL RESECTION OF THE PROSTATE (TURP) WITH CYSTOSCOPY;  Surgeon: Heloise Purpura, MD;  Location: [facility];  Service: Urology;  Laterality: N/A;  ANESTHESIA GENERAL OR SPINAL PER DR Laverle Patter    FAMILY HISTORY: Family History  Problem Relation Age of Onset   Diabetes Mother    Colon cancer Neg Hx    Pancreatic cancer Neg Hx    Stomach cancer Neg Hx     SOCIAL HISTORY: Social History   Socioeconomic History   Marital status: Married    Spouse name: Not on file   Number of children: 0   Years of education: some college   Highest education level: Not on file  Occupational History   Occupation: Forensic scientist  Tobacco Use   Smoking status: Never   Smokeless tobacco: Never  Vaping Use   Vaping Use: Never used  Substance and Sexual Activity   Alcohol use: No   Drug use: No   Sexual activity: Yes    Birth control/protection: None  Other Topics Concern   Not on file  Social History Narrative   Lives at home with his wife.   Right-handed.   No daily  caffeine use.    Social Determinants of Health   Financial Resource Strain: Not on file  Food Insecurity: Not on file  Transportation Needs: Not on file  Physical Activity: Not on file  Stress: Not on file  Social Connections: Not on file  Intimate Partner Violence: Not on file    PHYSICAL EXAM  GENERAL EXAM/CONSTITUTIONAL: Vitals:  Vitals:   11/03/22 1134  BP: 124/70  Pulse: 69  Weight: 264 lb (119.7 kg)  Height: 6\' 2"  (1.88 m)   Body mass index is 33.9 kg/m. Wt Readings from Last 3 Encounters:  11/03/22 264 lb (119.7 kg)  01/09/22 243 lb (110.2 kg)  01/05/22 254 lb (115.2 kg)   Patient is in no distress; well developed, nourished and groomed; neck is supple. Ear canals are clear of wax.   MUSCULOSKELETAL: Gait,  strength, tone, movements noted in Neurologic exam below  NEUROLOGIC: MENTAL STATUS:      No data to display            11/03/2022   11:40 AM  Montreal Cognitive Assessment   Visuospatial/ Executive (0/5) 5  Naming (0/3) 1  Attention: Read list of digits (0/2) 2  Attention: Read list of letters (0/1) 1  Attention: Serial 7 subtraction starting at 100 (0/3) 1  Language: Repeat phrase (0/2) 2  Language : Fluency (0/1) 0  Abstraction (0/2) 2  Delayed Recall (0/5) 0  Orientation (0/6) 5  Total 19  Adjusted Score (based on education) 20     CRANIAL NERVE:  2nd, 3rd, 4th, 6th - pupils equal and reactive to light, visual fields full to confrontation, extraocular muscles intact, no nystagmus 5th - facial sensation symmetric 7th - facial strength symmetric 8th - hearing intact 9th - palate elevates symmetrically, uvula midline 11th - shoulder shrug symmetric 12th - tongue protrusion midline  MOTOR:  normal bulk and tone, full strength in the BUE, BLE  SENSORY:  normal and symmetric to light touch  COORDINATION:  finger-nose-finger, fine finger movements normal   GAIT/STATION:  Leans toward the left with ambulation.    DIAGNOSTIC  DATA (LABS, IMAGING, TESTING) - I reviewed patient records, labs, notes, testing and imaging myself where available.  Lab Results  Component Value Date   WBC 7.6 01/05/2022   HGB 14.2 01/05/2022   HCT 42.4 01/05/2022   MCV 92.4 01/05/2022   PLT 186 01/05/2022      Component Value Date/Time   NA 139 01/05/2022 1015   K 3.3 (L) 01/05/2022 1015   CL 103 01/05/2022 1015   CO2 24 01/05/2022 1015   GLUCOSE 168 (H) 01/05/2022 1015   BUN 17 01/05/2022 1015   CREATININE 1.09 01/05/2022 1015   CALCIUM 9.2 01/05/2022 1015   PROT 6.6 10/30/2021 0753   ALBUMIN 4.3 10/30/2021 0753   AST 23 10/30/2021 0753   ALT 19 10/30/2021 0753   ALKPHOS 78 10/30/2021 0753   BILITOT 0.5 10/30/2021 0753   GFRNONAA >60 01/05/2022 1015   Lab Results  Component Value Date   CHOL 140 10/30/2021   HDL 80 10/30/2021   LDLCALC 48 10/30/2021   TRIG 58 10/30/2021   CHOLHDL 1.8 10/30/2021   Lab Results  Component Value Date   HGBA1C 6.1 (H) 03/14/2020   No results found for: "VITAMINB12" No results found for: "TSH"  TSH 08/26/22: 1.37  MRI Brain 01/05/22 1. No acute intracranial abnormality. 2. Scattered subcortical T2 hyperintensities bilaterally are mildly advanced for age. The finding is nonspecific but can be seen in the setting of chronic microvascular ischemia, a demyelinating process such as multiple sclerosis, vasculitis, complicated migraine headaches, or as the sequelae of a prior infectious or inflammatory process.   MRI Brain 10/22/2022 No acute intracranial process. No evidence of acute or subacute infarct. No etiology is seen for the patient's dizziness.   MRI Orbits 02/20/2022 Unremarkable MRI orbits with and without contrast.  No abnormal enhancing, inflammatory or compressive lesions.    CTA Head and Neck 01/05/22  1. Negative for large vessel occlusion. Negative for age CT appearance of the brain. 2. Minor atherosclerosis in the head and neck. No significant arterial stenosis. But  tortuous intracranial anterior circulation with a mildly beaded appearance of the Left MCA M1. Difficult to exclude Fibromuscular Dysplasia (FMD). 3. Visible upper chest stable since 2020, compatible with Sarcoidosis related lung and lymph  node changes.  ASSESSMENT AND PLAN  62 y.o. year old male with past medical history of hypertension, hyperlipidemia and diabetes who is presenting for follow up for dizziness and new memory complaints.  In terms of the dizziness, it is stable, he was diagnosed with right eustachian tube dysfunction and vestibular neuritis.  He reported the dizziness does not stop him from doing his work. There are new concerns of worsening memory and personality changes.  He is forgetful, irritable and easily upset.  On exam today he scored 20 out of 30 indicative of impairment.  Patient does have mild cognitive impairment, unclear if this is related to underlying depression or some form of dementia.  Plan for now is to obtain B12, ATN profile to look for Alzheimer disease biomarker and a referral for formal neuropsychological testing.  If ATN profile is negative, we will also consider sending him for to psychiatry for evaluation of possible depression.  His most recent MRI did not show any acute intracranial abnormality.  This was explained to the patient and wife and they are comfortable with plan.  I will see him in the office in 1 year for follow-up or sooner if worse.     1. Mild cognitive impairment   2. Vestibular neuronitis of right ear   3. Dysfunction of right eustachian tube       Patient Instructions  ATN to look for Alzheimer disease biomarker Will also check B12 level I will contact you over the results Referral for formal neuropsychological testing Continue your other medications return sooner if worse.  Orders Placed This Encounter  Procedures   ATN PROFILE   Vitamin B12   Ambulatory referral to Neuropsychology    No orders of the defined types were  placed in this encounter.   Return in about 1 year (around 11/03/2023).  I have spent a total of 65 minutes dedicated to this patient today, preparing to see patient, performing a medically appropriate examination and evaluation, ordering tests and/or medications and procedures, and counseling and educating the patient/family/caregiver; independently interpreting result and communicating results to the family/patient/caregiver; and documenting clinical information in the electronic medical record.  Windell Norfolk, MD 11/03/2022, 5:04 PM  [facility] [address], [address] [place], [place] [address] [phone]

## 2022-11-03 NOTE — Patient Instructions (Signed)
ATN to look for Alzheimer disease biomarker Will also check B12 level I will contact you over the results Referral for formal neuropsychological testing Continue your other medications return sooner if worse.

## 2022-11-04 ENCOUNTER — Telehealth: Payer: Self-pay | Admitting: Neurology

## 2022-11-04 NOTE — Telephone Encounter (Signed)
Neuropsych referral faxed to [facility] (fax# [phone], phone# [phone])

## 2022-11-06 LAB — ATN PROFILE
A -- Beta-amyloid 42/40 Ratio: 0.121 (ref >0.102)
Beta-amyloid 40: 177.27 pg/mL
Beta-amyloid 42: 21.44 pg/mL
N -- NfL, Plasma: 4.06 pg/mL (ref 0.00–4.61)
T -- p-tau181: 1.02 pg/mL — ABNORMAL HIGH (ref 0.00–0.97)

## 2022-11-06 LAB — VITAMIN B12: Vitamin B-12: 583 pg/mL (ref 232–1245)

## 2022-12-03 DIAGNOSIS — R3912 Poor urinary stream: Secondary | ICD-10-CM | POA: Diagnosis not present

## 2022-12-03 DIAGNOSIS — N401 Enlarged prostate with lower urinary tract symptoms: Secondary | ICD-10-CM | POA: Diagnosis not present

## 2022-12-10 DIAGNOSIS — N403 Nodular prostate with lower urinary tract symptoms: Secondary | ICD-10-CM | POA: Diagnosis not present

## 2022-12-10 DIAGNOSIS — R972 Elevated prostate specific antigen [PSA]: Secondary | ICD-10-CM | POA: Diagnosis not present

## 2022-12-10 DIAGNOSIS — N401 Enlarged prostate with lower urinary tract symptoms: Secondary | ICD-10-CM | POA: Diagnosis not present

## 2022-12-10 DIAGNOSIS — R3912 Poor urinary stream: Secondary | ICD-10-CM | POA: Diagnosis not present

## 2022-12-10 DIAGNOSIS — N5201 Erectile dysfunction due to arterial insufficiency: Secondary | ICD-10-CM | POA: Diagnosis not present

## 2023-02-10 DIAGNOSIS — R42 Dizziness and giddiness: Secondary | ICD-10-CM | POA: Diagnosis not present

## 2023-02-10 DIAGNOSIS — M9901 Segmental and somatic dysfunction of cervical region: Secondary | ICD-10-CM | POA: Diagnosis not present

## 2023-02-10 DIAGNOSIS — M9903 Segmental and somatic dysfunction of lumbar region: Secondary | ICD-10-CM | POA: Diagnosis not present

## 2023-02-10 DIAGNOSIS — M9902 Segmental and somatic dysfunction of thoracic region: Secondary | ICD-10-CM | POA: Diagnosis not present

## 2023-02-12 DIAGNOSIS — M9903 Segmental and somatic dysfunction of lumbar region: Secondary | ICD-10-CM | POA: Diagnosis not present

## 2023-02-12 DIAGNOSIS — M9902 Segmental and somatic dysfunction of thoracic region: Secondary | ICD-10-CM | POA: Diagnosis not present

## 2023-02-12 DIAGNOSIS — M9901 Segmental and somatic dysfunction of cervical region: Secondary | ICD-10-CM | POA: Diagnosis not present

## 2023-02-12 DIAGNOSIS — R42 Dizziness and giddiness: Secondary | ICD-10-CM | POA: Diagnosis not present

## 2023-02-16 DIAGNOSIS — M9903 Segmental and somatic dysfunction of lumbar region: Secondary | ICD-10-CM | POA: Diagnosis not present

## 2023-02-16 DIAGNOSIS — M9902 Segmental and somatic dysfunction of thoracic region: Secondary | ICD-10-CM | POA: Diagnosis not present

## 2023-02-16 DIAGNOSIS — R42 Dizziness and giddiness: Secondary | ICD-10-CM | POA: Diagnosis not present

## 2023-02-16 DIAGNOSIS — M9901 Segmental and somatic dysfunction of cervical region: Secondary | ICD-10-CM | POA: Diagnosis not present

## 2023-02-17 DIAGNOSIS — M9903 Segmental and somatic dysfunction of lumbar region: Secondary | ICD-10-CM | POA: Diagnosis not present

## 2023-02-17 DIAGNOSIS — M9901 Segmental and somatic dysfunction of cervical region: Secondary | ICD-10-CM | POA: Diagnosis not present

## 2023-02-17 DIAGNOSIS — M9902 Segmental and somatic dysfunction of thoracic region: Secondary | ICD-10-CM | POA: Diagnosis not present

## 2023-02-17 DIAGNOSIS — R42 Dizziness and giddiness: Secondary | ICD-10-CM | POA: Diagnosis not present

## 2023-02-19 DIAGNOSIS — M9901 Segmental and somatic dysfunction of cervical region: Secondary | ICD-10-CM | POA: Diagnosis not present

## 2023-02-19 DIAGNOSIS — M9902 Segmental and somatic dysfunction of thoracic region: Secondary | ICD-10-CM | POA: Diagnosis not present

## 2023-02-19 DIAGNOSIS — M9903 Segmental and somatic dysfunction of lumbar region: Secondary | ICD-10-CM | POA: Diagnosis not present

## 2023-02-19 DIAGNOSIS — R42 Dizziness and giddiness: Secondary | ICD-10-CM | POA: Diagnosis not present

## 2023-02-23 DIAGNOSIS — M9903 Segmental and somatic dysfunction of lumbar region: Secondary | ICD-10-CM | POA: Diagnosis not present

## 2023-02-23 DIAGNOSIS — M9902 Segmental and somatic dysfunction of thoracic region: Secondary | ICD-10-CM | POA: Diagnosis not present

## 2023-02-23 DIAGNOSIS — R42 Dizziness and giddiness: Secondary | ICD-10-CM | POA: Diagnosis not present

## 2023-02-23 DIAGNOSIS — M9901 Segmental and somatic dysfunction of cervical region: Secondary | ICD-10-CM | POA: Diagnosis not present

## 2023-02-24 DIAGNOSIS — M9901 Segmental and somatic dysfunction of cervical region: Secondary | ICD-10-CM | POA: Diagnosis not present

## 2023-02-24 DIAGNOSIS — R42 Dizziness and giddiness: Secondary | ICD-10-CM | POA: Diagnosis not present

## 2023-02-24 DIAGNOSIS — M9903 Segmental and somatic dysfunction of lumbar region: Secondary | ICD-10-CM | POA: Diagnosis not present

## 2023-02-24 DIAGNOSIS — M9902 Segmental and somatic dysfunction of thoracic region: Secondary | ICD-10-CM | POA: Diagnosis not present

## 2023-02-26 DIAGNOSIS — M9901 Segmental and somatic dysfunction of cervical region: Secondary | ICD-10-CM | POA: Diagnosis not present

## 2023-02-26 DIAGNOSIS — M9903 Segmental and somatic dysfunction of lumbar region: Secondary | ICD-10-CM | POA: Diagnosis not present

## 2023-02-26 DIAGNOSIS — R42 Dizziness and giddiness: Secondary | ICD-10-CM | POA: Diagnosis not present

## 2023-02-26 DIAGNOSIS — M9902 Segmental and somatic dysfunction of thoracic region: Secondary | ICD-10-CM | POA: Diagnosis not present

## 2023-03-02 DIAGNOSIS — M9903 Segmental and somatic dysfunction of lumbar region: Secondary | ICD-10-CM | POA: Diagnosis not present

## 2023-03-02 DIAGNOSIS — M9901 Segmental and somatic dysfunction of cervical region: Secondary | ICD-10-CM | POA: Diagnosis not present

## 2023-03-02 DIAGNOSIS — R42 Dizziness and giddiness: Secondary | ICD-10-CM | POA: Diagnosis not present

## 2023-03-02 DIAGNOSIS — M9902 Segmental and somatic dysfunction of thoracic region: Secondary | ICD-10-CM | POA: Diagnosis not present

## 2023-03-03 DIAGNOSIS — M9903 Segmental and somatic dysfunction of lumbar region: Secondary | ICD-10-CM | POA: Diagnosis not present

## 2023-03-03 DIAGNOSIS — M9901 Segmental and somatic dysfunction of cervical region: Secondary | ICD-10-CM | POA: Diagnosis not present

## 2023-03-03 DIAGNOSIS — M9902 Segmental and somatic dysfunction of thoracic region: Secondary | ICD-10-CM | POA: Diagnosis not present

## 2023-03-03 DIAGNOSIS — R42 Dizziness and giddiness: Secondary | ICD-10-CM | POA: Diagnosis not present

## 2023-03-05 DIAGNOSIS — M9903 Segmental and somatic dysfunction of lumbar region: Secondary | ICD-10-CM | POA: Diagnosis not present

## 2023-03-05 DIAGNOSIS — M9901 Segmental and somatic dysfunction of cervical region: Secondary | ICD-10-CM | POA: Diagnosis not present

## 2023-03-05 DIAGNOSIS — M9902 Segmental and somatic dysfunction of thoracic region: Secondary | ICD-10-CM | POA: Diagnosis not present

## 2023-03-05 DIAGNOSIS — R42 Dizziness and giddiness: Secondary | ICD-10-CM | POA: Diagnosis not present

## 2023-03-07 DIAGNOSIS — Z23 Encounter for immunization: Secondary | ICD-10-CM | POA: Diagnosis not present

## 2023-03-09 DIAGNOSIS — M9903 Segmental and somatic dysfunction of lumbar region: Secondary | ICD-10-CM | POA: Diagnosis not present

## 2023-03-09 DIAGNOSIS — R42 Dizziness and giddiness: Secondary | ICD-10-CM | POA: Diagnosis not present

## 2023-03-09 DIAGNOSIS — M9901 Segmental and somatic dysfunction of cervical region: Secondary | ICD-10-CM | POA: Diagnosis not present

## 2023-03-09 DIAGNOSIS — M9902 Segmental and somatic dysfunction of thoracic region: Secondary | ICD-10-CM | POA: Diagnosis not present

## 2023-03-10 DIAGNOSIS — R42 Dizziness and giddiness: Secondary | ICD-10-CM | POA: Diagnosis not present

## 2023-03-10 DIAGNOSIS — M9901 Segmental and somatic dysfunction of cervical region: Secondary | ICD-10-CM | POA: Diagnosis not present

## 2023-03-10 DIAGNOSIS — M9903 Segmental and somatic dysfunction of lumbar region: Secondary | ICD-10-CM | POA: Diagnosis not present

## 2023-03-10 DIAGNOSIS — M9902 Segmental and somatic dysfunction of thoracic region: Secondary | ICD-10-CM | POA: Diagnosis not present

## 2023-03-17 DIAGNOSIS — M9901 Segmental and somatic dysfunction of cervical region: Secondary | ICD-10-CM | POA: Diagnosis not present

## 2023-03-17 DIAGNOSIS — R42 Dizziness and giddiness: Secondary | ICD-10-CM | POA: Diagnosis not present

## 2023-03-17 DIAGNOSIS — M9903 Segmental and somatic dysfunction of lumbar region: Secondary | ICD-10-CM | POA: Diagnosis not present

## 2023-03-17 DIAGNOSIS — M9902 Segmental and somatic dysfunction of thoracic region: Secondary | ICD-10-CM | POA: Diagnosis not present

## 2023-03-18 NOTE — Progress Notes (Unsigned)
Cardiology Office Note:   Date:  03/19/2023  ID:  Blake Curtis, DOB [birth year], MRN [id number] PCP:  Geoffry Paradise, MD  [facility] Providers Cardiologist:  Alverda Skeans, MD Referring MD: Geoffry Paradise, MD  Chief Complaint/Reason for Referral: Cardiology follow-up ASSESSMENT:    1. Precordial pain   2. Type 2 diabetes mellitus without complication, without long-term current use of insulin (HCC)   3. Hypertension associated with diabetes (HCC)   4. Hyperlipidemia associated with type 2 diabetes mellitus (HCC)   5. Aortic atherosclerosis (HCC)   6. BMI 33.0-33.9,adult     PLAN:   In order of problems listed above: Chest pain: Has had a reassuring evaluation with low risk exercise treadmill stress test and echocardiogram. Type 2 diabetes: Continue aspirin, start Jardiance 10 mg daily, defer ARB and ACE inhibitor due to cough; start atorvastatin 40 mg daily.   Hypertension: Blood pressure is well-controlled today. Hyperlipidemia: Start atorvastatin and lipid panel LFTs, LP(a) in 2 months Aortic atherosclerosis: Continue aspirin and start statin with strict blood pressure control. Elevated BMI: Refer to pharmacy for GLP-1 receptor agonist given history of diabetes and elevated BMI.       Dispo:  Return in about 6 months (around 09/17/2023).     Medication Adjustments/Labs and Tests Ordered: Current medicines are reviewed at length with the patient today.  Concerns regarding medicines are outlined above.  The following changes have been made:     Labs/tests ordered: Orders Placed This Encounter  Procedures   Lipid Profile   Hepatic function panel   Lipoprotein A (LPA)   AMB Referral to [facility]   EKG 12-Lead    Medication Changes: Meds ordered this encounter  Medications   atorvastatin (LIPITOR) 40 MG tablet    Sig: Take 1 tablet (40 mg total) by mouth daily.    Dispense:  30 tablet    Refill:  6   empagliflozin (JARDIANCE) 10 MG TABS tablet    Sig:  Take 1 tablet (10 mg total) by mouth daily before breakfast.    Dispense:  30 tablet    Refill:  6    Current medicines are reviewed at length with the patient today.  The patient does not have concerns regarding medicines.  I spent 32 minutes reviewing all clinical data during and prior to this visit including all relevant imaging studies, laboratories, clinical information from other health systems, and prior notes from both Cardiology and other specialties, interviewing the patient, and conducting a complete physical examination in order to formulate a comprehensive and personalized evaluation and treatment plan.  History of Present Illness:      FOCUSED PROBLEM LIST:   Type 2 diabetes not on insulin Hypertension Losartan associated cough Hyperlipidemia GERD OSA not on CPAP Aortic atherosclerosis CT 2019 Chest pain  ETT negative for ischemia 2023 TTE 2023 normal LV and RV function no valvular abnormalities BMI 33 Vestibular neuritis Followed by neurology  April 2023:  The patient is a 62 y.o. male with the indicated medical history here for recommendations regarding chest pain.  The patient was seen by his primary care provider recently.  The patient's PCP noted that the patient had fleeting chest pain at rest.  He tells me that he occasionally gets it when he mows his lawn.  This happens maybe once or twice a month.  Sometimes stretching out his chest seems to help.  He denies any significant shortness of breath, bleeding, signs or symptoms stroke, paroxysmal nocturnal dyspnea, orthopnea, presyncope, or syncope.Marland Kitchen  He is required no emergency room visits or hospitalizations.  He is primary care provider recently started him on Benicar for improved blood pressure control.  He tells me his blood sugars are relatively well controlled at home.  Plan: Obtain exercise treadmill stress test and echocardiogram; start aspirin 81 mg, atorvastatin 40 mg consider SGLT2 inhibitor in the future, and  check lipid panel LFTs in 2 months.  October 2024: The patient returns for routine follow-up.  In the interim he had an exercise treadmill stress test which was low risk for ischemia.  Echocardiogram demonstrated normal LV function with no significant valvular abnormalities.  His LDL in the interim was 48.  He was also diagnosed with vestibular neuritis and mild cognitive impairment and is being followed by neurology.  He continues to be plagued by vestibular issues.  He has been checking his blood pressures at home and they are fairly well-controlled.  He denies any orthostasis.  He denies any chest pain or shortness of breath.  He has had no peripheral edema or paroxysmal nocturnal dyspnea.  He has required no emergency room visits or hospitalizations for cardiovascular issues per se but did have an emergency room visit for lightheadedness leading to his neurologic evaluation.          Current Medications: Current Meds  Medication Sig   aspirin EC 81 MG tablet Take 81 mg by mouth daily. Swallow whole.   atorvastatin (LIPITOR) 40 MG tablet Take 1 tablet (40 mg total) by mouth daily.   empagliflozin (JARDIANCE) 10 MG TABS tablet Take 1 tablet (10 mg total) by mouth daily before breakfast.   metFORMIN (GLUCOPHAGE) 500 MG tablet Take 1 tablet (500 mg total) by mouth daily with meal.     Review of Systems:   Please see the history of present illness.    All other systems reviewed and are negative.     EKGs/Labs/Other Test Reviewed:   EKG: EKG performed August 2023 that I reviewed demonstrates sinus rhythm with minimal ST elevations inferiorly.  EKG Interpretation Date/Time:    Ventricular Rate:    PR Interval:    QRS Duration:    QT Interval:    QTC Calculation:   R Axis:      Text Interpretation:           Risk Assessment/Calculations:          Physical Exam:   VS:  BP 130/84   Pulse 70   Ht 6\' 2"  (1.88 m)   Wt 269 lb 6.4 oz (122.2 kg)   SpO2 98%   BMI 34.59 kg/m    repeat blood pressure reading on my measure was 125/75 mmHg      Wt Readings from Last 3 Encounters:  03/19/23 269 lb 6.4 oz (122.2 kg)  11/03/22 264 lb (119.7 kg)  01/09/22 243 lb (110.2 kg)      GENERAL:  No apparent distress, AOx3 HEENT:  No carotid bruits, +2 carotid impulses, no scleral icterus CAR: RRR no murmurs, gallops, rubs, or thrills RES:  Clear to auscultation bilaterally ABD:  Soft, nontender, nondistended, positive bowel sounds x 4 VASC:  +2 radial pulses, +2 carotid pulses NEURO:  CN 2-12 grossly intact; motor and sensory grossly intact PSYCH:  No active depression or anxiety EXT:  No edema, ecchymosis, or cyanosis  Signed, Orbie Pyo, MD  03/19/2023 9:27 AM    [facility] [address] [place], [place], [place]  [address] Phone: [phone]; Fax: [phone]  Note:  This document was prepared using Dragon voice recognition software and may include unintentional dictation errors.

## 2023-03-19 ENCOUNTER — Other Ambulatory Visit (HOSPITAL_BASED_OUTPATIENT_CLINIC_OR_DEPARTMENT_OTHER): Payer: Self-pay

## 2023-03-19 ENCOUNTER — Ambulatory Visit: Payer: 59 | Attending: Internal Medicine | Admitting: Internal Medicine

## 2023-03-19 ENCOUNTER — Encounter: Payer: Self-pay | Admitting: Internal Medicine

## 2023-03-19 ENCOUNTER — Other Ambulatory Visit: Payer: Self-pay | Admitting: *Deleted

## 2023-03-19 VITALS — BP 130/84 | HR 70 | Ht 74.0 in | Wt 269.4 lb

## 2023-03-19 DIAGNOSIS — E785 Hyperlipidemia, unspecified: Secondary | ICD-10-CM | POA: Diagnosis not present

## 2023-03-19 DIAGNOSIS — I152 Hypertension secondary to endocrine disorders: Secondary | ICD-10-CM

## 2023-03-19 DIAGNOSIS — E1169 Type 2 diabetes mellitus with other specified complication: Secondary | ICD-10-CM

## 2023-03-19 DIAGNOSIS — Z6833 Body mass index (BMI) 33.0-33.9, adult: Secondary | ICD-10-CM

## 2023-03-19 DIAGNOSIS — I700 Atherosclerosis of aorta: Secondary | ICD-10-CM

## 2023-03-19 DIAGNOSIS — E1159 Type 2 diabetes mellitus with other circulatory complications: Secondary | ICD-10-CM | POA: Diagnosis not present

## 2023-03-19 DIAGNOSIS — E119 Type 2 diabetes mellitus without complications: Secondary | ICD-10-CM

## 2023-03-19 DIAGNOSIS — R072 Precordial pain: Secondary | ICD-10-CM | POA: Diagnosis not present

## 2023-03-19 MED ORDER — EMPAGLIFLOZIN 10 MG PO TABS
10.0000 mg | ORAL_TABLET | Freq: Every day | ORAL | 6 refills | Status: DC
  Filled 2023-03-19: qty 30, 30d supply, fill #0

## 2023-03-19 MED ORDER — ATORVASTATIN CALCIUM 40 MG PO TABS
40.0000 mg | ORAL_TABLET | Freq: Every day | ORAL | 6 refills | Status: DC
  Filled 2023-03-19: qty 30, 30d supply, fill #0

## 2023-03-19 MED ORDER — EMPAGLIFLOZIN 10 MG PO TABS: 10.0000 mg | ORAL_TABLET | Freq: Every day | ORAL | 0 refills | Status: DC

## 2023-03-19 NOTE — Patient Instructions (Signed)
Medication Instructions:  Your physician has recommended you make the following change in your medication: Start Jardiance 10 mg by mouth daily  Start Atorvastatin 40 mg by mouth daily   *If you need a refill on your cardiac medications before your next appointment, please call your pharmacy*   Lab Work: Your physician recommends that you return for lab work in: 2 months--Lipid,liver, Lp(a).  If you have labs (blood work) drawn today and your tests are completely normal, you will receive your results only by: MyChart Message (if you have MyChart) OR A paper copy in the mail If you have any lab test that is abnormal or we need to change your treatment, we will call you to review the results.   Testing/Procedures: none   Follow-Up: At [facility], you and your health needs are our priority.  As part of our continuing mission to provide you with exceptional heart care, we have created designated Provider Care Teams.  These Care Teams include your primary Cardiologist (physician) and Advanced Practice Providers (APPs -  Physician Assistants and Nurse Practitioners) who all work together to provide you with the care you need, when you need it.  We recommend signing up for the patient portal called "MyChart".  Sign up information is provided on this After Visit Summary.  MyChart is used to connect with patients for Virtual Visits (Telemedicine).  Patients are able to view lab/test results, encounter notes, upcoming appointments, etc.  Non-urgent messages can be sent to your provider as well.   To learn more about what you can do with MyChart, go to [email or web address].    Your next appointment:   6 month(s)  Provider:   Jari Favre, PA-C, Ronie Spies, PA-C, Robin Searing, NP, Jacolyn Reedy, PA-C, Eligha Bridegroom, NP, Tereso Newcomer, PA-C, or Perlie Gold, PA-C         Other Instructions You have been referred to see the pharmacist in our office.  Please schedule new patient  appointment.

## 2023-03-19 NOTE — Addendum Note (Signed)
Addended by: Dossie Arbour on: 03/19/2023 09:30 AM   Modules accepted: Orders

## 2023-03-20 ENCOUNTER — Other Ambulatory Visit (HOSPITAL_BASED_OUTPATIENT_CLINIC_OR_DEPARTMENT_OTHER): Payer: Self-pay

## 2023-03-23 ENCOUNTER — Other Ambulatory Visit (HOSPITAL_BASED_OUTPATIENT_CLINIC_OR_DEPARTMENT_OTHER): Payer: Self-pay

## 2023-03-23 ENCOUNTER — Encounter: Payer: Self-pay | Admitting: Physician Assistant

## 2023-03-23 ENCOUNTER — Other Ambulatory Visit (INDEPENDENT_AMBULATORY_CARE_PROVIDER_SITE_OTHER): Payer: 59

## 2023-03-23 ENCOUNTER — Ambulatory Visit: Payer: 59 | Admitting: Physician Assistant

## 2023-03-23 VITALS — BP 122/80 | HR 68 | Ht 72.5 in | Wt 263.4 lb

## 2023-03-23 DIAGNOSIS — K5904 Chronic idiopathic constipation: Secondary | ICD-10-CM | POA: Diagnosis not present

## 2023-03-23 DIAGNOSIS — R972 Elevated prostate specific antigen [PSA]: Secondary | ICD-10-CM

## 2023-03-23 DIAGNOSIS — R630 Anorexia: Secondary | ICD-10-CM | POA: Diagnosis not present

## 2023-03-23 LAB — CBC WITH DIFFERENTIAL/PLATELET
Basophils Absolute: 0 10*3/uL (ref 0.0–0.1)
Basophils Relative: 0.6 % (ref 0.0–3.0)
Eosinophils Absolute: 0.1 10*3/uL (ref 0.0–0.7)
Eosinophils Relative: 2.5 % (ref 0.0–5.0)
HCT: 43.7 % (ref 39.0–52.0)
Hemoglobin: 14.3 g/dL (ref 13.0–17.0)
Lymphocytes Relative: 31.7 % (ref 12.0–46.0)
Lymphs Abs: 1.6 10*3/uL (ref 0.7–4.0)
MCHC: 32.6 g/dL (ref 30.0–36.0)
MCV: 93.8 fL (ref 78.0–100.0)
Monocytes Absolute: 0.5 10*3/uL (ref 0.1–1.0)
Monocytes Relative: 10.1 % (ref 3.0–12.0)
Neutro Abs: 2.7 10*3/uL (ref 1.4–7.7)
Neutrophils Relative %: 55.1 % (ref 43.0–77.0)
Platelets: 185 10*3/uL (ref 150.0–400.0)
RBC: 4.66 Mil/uL (ref 4.22–5.81)
RDW: 12.9 % (ref 11.5–15.5)
WBC: 5 10*3/uL (ref 4.0–10.5)

## 2023-03-23 LAB — COMPREHENSIVE METABOLIC PANEL
ALT: 17 U/L (ref 0–53)
AST: 23 U/L (ref 0–37)
Albumin: 4.5 g/dL (ref 3.5–5.2)
Alkaline Phosphatase: 69 U/L (ref 39–117)
BUN: 19 mg/dL (ref 6–23)
CO2: 27 meq/L (ref 19–32)
Calcium: 9.6 mg/dL (ref 8.4–10.5)
Chloride: 101 meq/L (ref 96–112)
Creatinine, Ser: 1.22 mg/dL (ref 0.40–1.50)
GFR: 64.4 mL/min (ref >60.00)
Glucose, Bld: 91 mg/dL (ref 70–99)
Potassium: 3.9 meq/L (ref 3.5–5.1)
Sodium: 138 meq/L (ref 135–145)
Total Bilirubin: 0.6 mg/dL (ref 0.2–1.2)
Total Protein: 7.2 g/dL (ref 6.0–8.3)

## 2023-03-23 LAB — TSH: TSH: 1.72 u[IU]/mL (ref 0.35–5.50)

## 2023-03-23 LAB — SEDIMENTATION RATE: Sed Rate: 12 mm/h (ref 0–20)

## 2023-03-23 MED ORDER — LINACLOTIDE 145 MCG PO CAPS
145.0000 ug | ORAL_CAPSULE | Freq: Every day | ORAL | 3 refills | Status: DC
  Filled 2023-03-23: qty 90, 90d supply, fill #0

## 2023-03-23 NOTE — Patient Instructions (Addendum)
Your provider has requested that you go to the basement level for lab work before leaving today. Press "B" on the elevator. The lab is located at the first door on the left as you exit the elevator. Also, go to the x-ray department after your labs.  Linzess try 145 mcg/290 mcg *IBS-C patients may begin to experience relief from belly pain and overall abdominal symptoms (pain, discomfort, and bloating) in about 1 week,  with symptoms typically improving over 12 weeks. 1 Take at least 30 minutes before the first meal of the day on an empty stomach You can have a loose stool if you eat a high-fat breakfast. Give it at least 7 days, may have more bowel movements during that time.   The diarrhea should go away and you should start having normal, complete, full bowel movements.  It may be helpful to start treatment when you can be near the comfort of your own bathroom, such as a weekend.  After you are out we can send in a prescription if you did well, there is a prescription card  Recommend starting on a fiber supplement, can try metamucil first but if this causes gas/bloating switch to benefiber or citracel, these do not cause gas.  Take with fiber with with a full 8 oz glass of water once a day. This can take 1 month to start helping, so try for at least one month.  Recommend increasing water and physical activity.   - Drink at least 64-80 ounces of water/liquid per day. - Establish a time to try to move your bowels every day.  For many people, this is after a cup of coffee or after a meal such as breakfast. - Sit all of the way back on the toilet keeping your back fairly straight and while sitting up, try to rest the tops of your forearms on your upper thighs.   - Raising your feet with a step stool/squatty potty can be helpful to improve the angle that allows your stool to pass through the rectum. - Relax the rectum feeling it bulge toward the toilet water.  If you feel your rectum raising toward  your body, you are contracting rather than relaxing. - Breathe in and slowly exhale. "Belly breath" by expanding your belly towards your belly button. Keep belly expanded as you gently direct pressure down and back to the anus.  A low pitched GRRR sound can assist with increasing intra-abdominal pressure.  (Can also trying to blow on a pinwheel and make it move, this helps with the same belly breathing) - Repeat 3-4 times. If unsuccessful, contract the pelvic floor to restore normal tone and get off the toilet.  Avoid excessive straining. - To reduce excessive wiping by teaching your anus to normally contract, place hands on outer aspect of knees and resist knee movement outward.  Hold 5-10 second then place hands just inside of knees and resist inward movement of knees.  Hold 5 seconds.  Repeat a few times each way.  Go to the ER if unable to pass gas, severe AB pain, unable to hold down food, any shortness of breath of chest pain.  We are referring you to Dr. Alvester Morin at [facility]. They will contact you with an appt.

## 2023-03-23 NOTE — Progress Notes (Signed)
03/23/2023 Blake Curtis [id number] 06/01/1960  Referring provider: Geoffry Paradise, MD Primary GI doctor: Dr. Leone Payor  ASSESSMENT AND PLAN:   Chronic Constipation Last colonoscopy in 2014, due for recall. Constipation for over a year with bowel movements once a week, often requiring laxatives. No abdominal pain, but reports bloating. No rectal pain or blood in stool. Stools are hard without laxatives. No weight loss. No changes in diet, activity, or medications that could explain constipation. -Order labs to rule out metabolic causes of constipation. -Prescribe Linzess samples and send prescription to pharmacy. If insurance does not cover, will prescribe Amitiza. -Schedule colonoscopy with two-day prep due to severity of constipation. We have discussed the risks of bleeding, infection, perforation, medication reactions, and remote risk of death associated with colonoscopy. All questions were answered and the patient acknowledges these risk and wishes to proceed.  Prostate Nodule with elevated PSA Prostate nodule noted in past with elevated PSA in 2021. No current urinary symptoms. Declines rectal exam -Refer to urologist for evaluation of prostate and elevated PSA. - check PSA today   Patient Care Team: Geoffry Paradise, MD as PCP - General (Internal Medicine) Orbie Pyo, MD as PCP - Cardiology (Cardiology)  HISTORY OF PRESENT ILLNESS: 62 y.o. male with a past medical history of hypertension, OSA, sarcoidosis, type 2 diabetes, personal history of adenomatous polyps and others listed below presents for evaluation of colonoscopy and constipation  04/29/2013 colonoscopy Dr. Leone Payor for screening purposes showed 4 mm sessile polyp transverse colon, excellent prep small inferior midline prostate nodule.  Path showed benign polyp recall 10 years.  Discussed the use of AI scribe software for clinical note transcription with the patient, who gave verbal consent to proceed.  The  patient, with a history of diabetes and a prostate nodule detected in the past, presents with chronic constipation for over a year. He reports having bowel movements only once a week, often requiring a laxative to facilitate this. The patient denies abdominal pain but does experience bloating. The stool is described as hard when a laxative is not used. He denies any rectal pain, blood in the stool, or weight loss. The patient's medication regimen includes metformin, aspirin, and a recent addition of Jardiance last week. He denies any changes in diet or activity level that could contribute to the constipation. The patient also reports a sensation of fullness in the upper abdomen, which is relieved after a bowel movement. He denies any nausea, vomiting, reflux, or difficulty swallowing.  The patient's last colonoscopy was in 2014 and he is due for a recall. He also had an elevated PSA in 2021 of 7.8, but it is unclear if this is being actively monitored. The patient denies any urinary issues. Denies ever seeing urologist.   He  reports that he has never smoked. He has never used smokeless tobacco. He reports that he does not drink alcohol and does not use drugs.  RELEVANT LABS AND IMAGING:  Results   LABS PSA: 7.8 (2021)  DIAGNOSTIC Colonoscopy: Normal (2014)      CBC    Component Value Date/Time   WBC 7.6 01/05/2022 1015   RBC 4.59 01/05/2022 1015   HGB 14.2 01/05/2022 1015   HCT 42.4 01/05/2022 1015   PLT 186 01/05/2022 1015   MCV 92.4 01/05/2022 1015   MCH 30.9 01/05/2022 1015   MCHC 33.5 01/05/2022 1015   RDW 12.1 01/05/2022 1015   LYMPHSABS 2.4 01/05/2022 1015   MONOABS 0.6 01/05/2022 1015   EOSABS  0.1 01/05/2022 1015   BASOSABS 0.0 01/05/2022 1015   No results for input(s): "HGB" in the last 8760 hours.  CMP     Component Value Date/Time   NA 139 01/05/2022 1015   K 3.3 (L) 01/05/2022 1015   CL 103 01/05/2022 1015   CO2 24 01/05/2022 1015   GLUCOSE 168 (H) 01/05/2022  1015   BUN 17 01/05/2022 1015   CREATININE 1.09 01/05/2022 1015   CALCIUM 9.2 01/05/2022 1015   PROT 6.6 10/30/2021 0753   ALBUMIN 4.3 10/30/2021 0753   AST 23 10/30/2021 0753   ALT 19 10/30/2021 0753   ALKPHOS 78 10/30/2021 0753   BILITOT 0.5 10/30/2021 0753   GFRNONAA >60 01/05/2022 1015      Latest Ref Rng & Units 10/30/2021    7:53 AM 03/10/2018   10:47 AM  Hepatic Function  Total Protein 6.0 - 8.5 g/dL 6.6  7.5   Albumin 3.8 - 4.9 g/dL 4.3  4.4   AST 0 - 40 IU/L 23  20   ALT 0 - 44 IU/L 19  18   Alk Phosphatase 44 - 121 IU/L 78  74   Total Bilirubin 0.0 - 1.2 mg/dL 0.5  0.6   Bilirubin, Direct 0.00 - 0.40 mg/dL 1.61        Current Medications:   Current Outpatient Medications (Endocrine & Metabolic):    empagliflozin (JARDIANCE) 10 MG TABS tablet, Take 1 tablet (10 mg total) by mouth daily before breakfast.   metFORMIN (GLUCOPHAGE) 500 MG tablet, Take 1 tablet (500 mg total) by mouth daily with meal.  Current Outpatient Medications (Cardiovascular):    atorvastatin (LIPITOR) 40 MG tablet, Take 1 tablet (40 mg total) by mouth daily.   Current Outpatient Medications (Analgesics):    aspirin EC 81 MG tablet, Take 81 mg by mouth daily. Swallow whole.   Current Outpatient Medications (Other):    linaclotide (LINZESS) 145 MCG CAPS capsule, Take 1 capsule (145 mcg total) by mouth daily before breakfast.   Medical History:  Past Medical History:  Diagnosis Date   Diabetes mellitus without complication (HCC)    diet controlled   Hypertension    Sleep apnea    No CPAP   Allergies:  Allergies  Allergen Reactions   Losartan Cough     Surgical History:  He  has a past surgical history that includes Appendectomy (2002) and Transurethral resection of prostate (N/A, 03/19/2020). Family History:  His family history includes Diabetes in his mother and sister.  REVIEW OF SYSTEMS  : All other systems reviewed and negative except where noted in the History of Present  Illness.  PHYSICAL EXAM: BP 122/80   Pulse 68   Ht 6' 0.5" (1.842 m)   Wt 263 lb 6.4 oz (119.5 kg)   BMI 35.23 kg/m  General Appearance: Well nourished, in no apparent distress. Head:   Normocephalic and atraumatic. Eyes:  sclerae anicteric,conjunctive pink  Respiratory: Respiratory effort normal, BS equal bilaterally without rales, rhonchi, wheezing. Cardio: RRR with no MRGs. Peripheral pulses intact.  Abdomen: Soft,  Obese ,active bowel sounds. No tenderness . Without guarding and Without rebound. No masses. Rectal: declines Musculoskeletal: Full ROM, Normal gait. Without edema. Skin:  Dry and intact without significant lesions or rashes Neuro: Alert and  oriented x4;  No focal deficits. Psych:  Cooperative. Normal mood and affect.   Doree Albee, PA-C 11:44 AM  Primary gastroenterologist:  I was able to do a chart review and uncovered that he did  have a TURP in 2021.  Urology follow-up seems reasonable but he has seen a urologist despite his denial.  Agree with other plans.  Iva Boop, MD, Clementeen Graham

## 2023-03-24 LAB — PSA: PSA: 3.89 ng/mL (ref 0.10–4.00)

## 2023-03-29 ENCOUNTER — Telehealth: Payer: Self-pay | Admitting: Pharmacist

## 2023-03-29 NOTE — Telephone Encounter (Signed)
GLP1 coverage assessment

## 2023-03-29 NOTE — Progress Notes (Unsigned)
Patient ID: Blake Curtis                 DOB: 03/12/1961                    MRN: [id number]     HPI: Blake Curtis is a 61 y.o. male patient referred to pharmacy clinic by Dr.Thukkani  to initiate GLP1-RA therapy. PMH is significant for T2Dm, HLD, HTN, aortic stenosis, and obesity. Most recent BMI 35.21.  Baseline weight and BMI: 254 lbs 32.6 Current weight and BMI: 263 lbs and 35.21 Current meds that affect weight: none     Diet: eat out everyday 2 meals and sometimes 3 meals  Reiterated importance of healthy whole home cooked meals.    Exercise: none  Work involves lots of physical activity so does not do any exercise    Family History:  Mother (Deceased) Diabetes     Father - UNKNOWN HISTORY   Sister Metallurgist) Diabetes     Social History:  Alcohol: none  Smoking: none   Labs: Lab Results  Component Value Date   HGBA1C 6.1 (H) 03/14/2020    Wt Readings from Last 1 Encounters:  03/23/23 263 lb 6.4 oz (119.5 kg)    BP Readings from Last 1 Encounters:  03/23/23 122/80   Pulse Readings from Last 1 Encounters:  03/23/23 68       Component Value Date/Time   CHOL 140 10/30/2021 0753   TRIG 58 10/30/2021 0753   HDL 80 10/30/2021 0753   CHOLHDL 1.8 10/30/2021 0753   LDLCALC 48 10/30/2021 0753    Past Medical History:  Diagnosis Date   Diabetes mellitus without complication (HCC)    diet controlled   Hypertension    Sleep apnea    No CPAP    Current Outpatient Medications on File Prior to Visit  Medication Sig Dispense Refill   aspirin EC 81 MG tablet Take 81 mg by mouth daily. Swallow whole.     atorvastatin (LIPITOR) 40 MG tablet Take 1 tablet (40 mg total) by mouth daily. 30 tablet 6   empagliflozin (JARDIANCE) 10 MG TABS tablet Take 1 tablet (10 mg total) by mouth daily before breakfast. 30 tablet 6   linaclotide (LINZESS) 145 MCG CAPS capsule Take 1 capsule (145 mcg total) by mouth daily before breakfast. 90 capsule 3   metFORMIN (GLUCOPHAGE) 500 MG  tablet Take 1 tablet (500 mg total) by mouth daily with meal. 90 tablet 3   No current facility-administered medications on file prior to visit.    Allergies  Allergen Reactions   Losartan Cough     Assessment/Plan:  Diabetes/ Weight loss  Patient has been taking metformin 500 mg every other day and started taking Jardiance 10 mg. His FBG ~110 he has started gaining weight. Given his T2DM and other CVD ( HTN, Aortic stenosis) despite controlled A1c it is reasonable to  consider GLP1 treatment to reduce future risk. We will assess coverage for Ozempic or Mounjaro.   Confirmed patient has no personal or family history of medullary thyroid carcinoma (MTC) or Multiple Endocrine Neoplasia syndrome type 2 (MEN 2), gall bladder issue or pancreatitis. Injection technique reviewed at today's visit.  Advised patient on common side effects including nausea, diarrhea, dyspepsia, decreased appetite, and fatigue. Counseled patient on reducing meal size and how to titrate medication to minimize side effects. Counseled patient to call if intolerable side effects or if experiencing dehydration, abdominal pain, or dizziness. Patient will adhere to dietary  modifications and will target at least 150 minutes of moderate intensity exercise weekly.   Follow up in 1 month via telephone for tolerability update and dose titration.

## 2023-03-30 ENCOUNTER — Other Ambulatory Visit (HOSPITAL_COMMUNITY): Payer: Self-pay

## 2023-03-30 ENCOUNTER — Other Ambulatory Visit (HOSPITAL_BASED_OUTPATIENT_CLINIC_OR_DEPARTMENT_OTHER): Payer: Self-pay

## 2023-03-30 ENCOUNTER — Ambulatory Visit: Payer: 59 | Attending: Cardiovascular Disease | Admitting: Student

## 2023-03-30 ENCOUNTER — Encounter: Payer: Self-pay | Admitting: Student

## 2023-03-30 ENCOUNTER — Telehealth: Payer: Self-pay | Admitting: Pharmacy Technician

## 2023-03-30 DIAGNOSIS — Z6833 Body mass index (BMI) 33.0-33.9, adult: Secondary | ICD-10-CM | POA: Diagnosis not present

## 2023-03-30 MED ORDER — MOUNJARO 2.5 MG/0.5ML ~~LOC~~ SOAJ
2.5000 mg | SUBCUTANEOUS | 0 refills | Status: DC
  Filled 2023-03-30: qty 2, 28d supply, fill #0

## 2023-03-30 NOTE — Telephone Encounter (Signed)
Pharmacy Patient Advocate Encounter  Received notification from [facility] that Prior Authorization for mounjaro  has been APPROVED from 03/30/23 to 03/29/24. Ran test claim, Copay is $25.00- one month. This test claim was processed through [facility]- copay amounts may vary at other pharmacies due to pharmacy/plan contracts, or as the patient moves through the different stages of their insurance plan.   PA #/Case ID/Reference #: [id number]

## 2023-03-30 NOTE — Telephone Encounter (Signed)
Pharmacy Patient Advocate Encounter   Received notification from Pt Calls Messages that prior authorization for mounjaro is required/requested.   Insurance verification completed.   The patient is insured through [facility] .   Per test claim: PA required; PA submitted to above mentioned insurance via CoverMyMeds Key/confirmation #/EOC [id number] Status is pending

## 2023-03-30 NOTE — Patient Instructions (Signed)
GLP1 Agonist Titration Plan:  Will plan to follow the titration plan as below, pending patient is tolerating each dose before increasing to the next. Can slow titration if needed for tolerability.    Ozempic:  -Month 1: Inject Ozempic 0.25 mg SQ once weekly x 4 weeks -Month 2: Inject Ozempic 0.5 mg  SQ once weekly x 4 weeks -Month 3: Inject Ozempic 1 mg SQ once weekly x 4 weeks -Month 4+: Inject Ozempic 2 mg SQ once weekly  

## 2023-03-30 NOTE — Addendum Note (Signed)
Addended by: Tylene Fantasia on: 03/30/2023 10:09 AM   Modules accepted: Orders

## 2023-03-31 ENCOUNTER — Encounter: Payer: Self-pay | Admitting: Internal Medicine

## 2023-04-01 ENCOUNTER — Other Ambulatory Visit (HOSPITAL_BASED_OUTPATIENT_CLINIC_OR_DEPARTMENT_OTHER): Payer: Self-pay

## 2023-04-01 ENCOUNTER — Encounter: Payer: Self-pay | Admitting: Internal Medicine

## 2023-04-02 ENCOUNTER — Telehealth: Payer: Self-pay

## 2023-04-02 ENCOUNTER — Other Ambulatory Visit (HOSPITAL_BASED_OUTPATIENT_CLINIC_OR_DEPARTMENT_OTHER): Payer: Self-pay

## 2023-04-02 ENCOUNTER — Other Ambulatory Visit (HOSPITAL_COMMUNITY): Payer: Self-pay

## 2023-04-02 MED ORDER — METFORMIN HCL 500 MG PO TABS
500.0000 mg | ORAL_TABLET | Freq: Every day | ORAL | 3 refills | Status: DC
  Filled 2023-04-02 (×2): qty 90, 90d supply, fill #0

## 2023-04-02 NOTE — Telephone Encounter (Signed)
Informed patient that his appt with Dr. Laverle Patter at [facility] is on 04/08/23 at 2:00 pm. Provided patient phone number and location to [facility] in case patient cannot make this appt. Patient verbalized understanding.

## 2023-04-08 DIAGNOSIS — N401 Enlarged prostate with lower urinary tract symptoms: Secondary | ICD-10-CM | POA: Diagnosis not present

## 2023-04-08 DIAGNOSIS — N403 Nodular prostate with lower urinary tract symptoms: Secondary | ICD-10-CM | POA: Diagnosis not present

## 2023-04-08 DIAGNOSIS — R972 Elevated prostate specific antigen [PSA]: Secondary | ICD-10-CM | POA: Diagnosis not present

## 2023-04-08 DIAGNOSIS — R3912 Poor urinary stream: Secondary | ICD-10-CM | POA: Diagnosis not present

## 2023-04-10 ENCOUNTER — Ambulatory Visit: Payer: 59 | Admitting: Internal Medicine

## 2023-04-10 ENCOUNTER — Other Ambulatory Visit (HOSPITAL_BASED_OUTPATIENT_CLINIC_OR_DEPARTMENT_OTHER): Payer: Self-pay

## 2023-04-10 ENCOUNTER — Encounter: Payer: Self-pay | Admitting: Internal Medicine

## 2023-04-10 VITALS — BP 141/91 | HR 61 | Temp 97.3°F | Resp 9 | Ht 72.5 in | Wt 263.0 lb

## 2023-04-10 DIAGNOSIS — K5904 Chronic idiopathic constipation: Secondary | ICD-10-CM | POA: Diagnosis not present

## 2023-04-10 DIAGNOSIS — K6389 Other specified diseases of intestine: Secondary | ICD-10-CM | POA: Diagnosis not present

## 2023-04-10 DIAGNOSIS — K5140 Inflammatory polyps of colon without complications: Secondary | ICD-10-CM | POA: Diagnosis not present

## 2023-04-10 DIAGNOSIS — D123 Benign neoplasm of transverse colon: Secondary | ICD-10-CM | POA: Diagnosis not present

## 2023-04-10 DIAGNOSIS — D120 Benign neoplasm of cecum: Secondary | ICD-10-CM

## 2023-04-10 DIAGNOSIS — Z1211 Encounter for screening for malignant neoplasm of colon: Secondary | ICD-10-CM

## 2023-04-10 MED ORDER — SODIUM CHLORIDE 0.9 % IV SOLN: 500.0000 mL | INTRAVENOUS | Status: AC

## 2023-04-10 MED ORDER — LUBIPROSTONE 24 MCG PO CAPS
24.0000 ug | ORAL_CAPSULE | Freq: Two times a day (BID) | ORAL | 5 refills | Status: DC
  Filled 2023-04-10: qty 60, 30d supply, fill #0

## 2023-04-10 NOTE — Op Note (Addendum)
[facility] Patient Name: Blake Curtis Procedure Date: 04/10/2023 3:37 PM MRN: [id number] Endoscopist: Iva Boop , MD, [id number] Age: 62 Referring MD:  Date of Birth: 06/05/1960 Gender: Male Account #: [id number] Procedure:                Colonoscopy Indications:              Screening for colorectal malignant neoplasm - in                            background of chronic constipation Medicines:                Monitored Anesthesia Care Procedure:                Pre-Anesthesia Assessment:                           - Prior to the procedure, a History and Physical                            was performed, and patient medications and                            allergies were reviewed. The patient's tolerance of                            previous anesthesia was also reviewed. The risks                            and benefits of the procedure and the sedation                            options and risks were discussed with the patient.                            All questions were answered, and informed consent                            was obtained. Prior Anticoagulants: The patient has                            taken no anticoagulant or antiplatelet agents. ASA                            Grade Assessment: III - A patient with severe                            systemic disease. After reviewing the risks and                            benefits, the patient was deemed in satisfactory                            condition to undergo the procedure.  After obtaining informed consent, the colonoscope                            was passed under direct vision. Throughout the                            procedure, the patient's blood pressure, pulse, and                            oxygen saturations were monitored continuously. The                            Olympus Scope [id number] was introduced through the                            anus and advanced to  the the cecum, identified by                            appendiceal orifice and ileocecal valve. The                            colonoscopy was performed without difficulty. The                            patient tolerated the procedure well. The quality                            of the bowel preparation was adequate. The                            ileocecal valve, appendiceal orifice, and rectum                            were photographed. The bowel preparation used was                            Miralax via split dose instruction. Scope In: 3:46:43 PM Scope Out: 4:09:17 PM Scope Withdrawal Time: 0 hours 17 minutes 48 seconds  Total Procedure Duration: 0 hours 22 minutes 34 seconds  Findings:                 The perianal and digital rectal examinations were                            normal.                           Two sessile polyps were found in the transverse                            colon and appendiceal orifice. The polyps were 1 to                            2 mm in size. These polyps were removed with a cold  biopsy forceps. Resection and retrieval were                            complete. Verification of patient identification                            for the specimen was done. Estimated blood loss was                            minimal.                           The exam was otherwise without abnormality on                            direct and retroflexion views. Complications:            No immediate complications. Estimated Blood Loss:     Estimated blood loss was minimal. Impression:               - Two 1 to 2 mm polyps in the transverse colon and                            at the appendiceal orifice, removed with a cold                            biopsy forceps. Resected and retrieved.                           - The examination was otherwise normal on direct                            and retroflexion views. Recommendation:           -  Patient has a contact number available for                            emergencies. The signs and symptoms of potential                            delayed complications were discussed with the                            patient. Return to normal activities tomorrow.                            Written discharge instructions were provided to the                            patient.                           - Resume previous diet.                           - Continue present medications.                           -  Await pathology results.                           - Repeat colonoscopy is recommended. The                            colonoscopy date will be determined after pathology                            results from today's exam become available for                            review.                           - He was given Linzess samples but that is not on                            formulary                           Will try lubiprostone 24 ug bid - about to start                            Mojuaro so that could acffect defecation also.                            ADDENDUM - wife said they got Linzess - it was                            covered apparently (computer says not on formulary)                            - but she wanted him to try lubiprostone - to see                            if cheaper - Iva Boop, MD 04/10/2023 4:19:23 PM This report has been signed electronically.

## 2023-04-10 NOTE — Patient Instructions (Addendum)
There were 2 very tiny polyps removed. Otherwise ok.  I will let you know pathology results and when to have another routine colonoscopy by mail and/or My Chart.  I am sending in a prescription for lubiprostone to treat constipation.  I appreciate the opportunity to care for you. Iva Boop, MD, FACG   YOU HAD AN ENDOSCOPIC PROCEDURE TODAY AT THE [facility]:   Refer to the procedure report that was given to you for any specific questions about what was found during the examination.  If the procedure report does not answer your questions, please call your gastroenterologist to clarify.  If you requested that your care partner not be given the details of your procedure findings, then the procedure report has been included in a sealed envelope for you to review at your convenience later.  YOU SHOULD EXPECT: Some feelings of bloating in the abdomen. Passage of more gas than usual.  Walking can help get rid of the air that was put into your GI tract during the procedure and reduce the bloating. If you had a lower endoscopy (such as a colonoscopy or flexible sigmoidoscopy) you may notice spotting of blood in your stool or on the toilet paper. If you underwent a bowel prep for your procedure, you may not have a normal bowel movement for a few days.  Please Note:  You might notice some irritation and congestion in your nose or some drainage.  This is from the oxygen used during your procedure.  There is no need for concern and it should clear up in a day or so.  SYMPTOMS TO REPORT IMMEDIATELY:  Following lower endoscopy (colonoscopy or flexible sigmoidoscopy):  Excessive amounts of blood in the stool  Significant tenderness or worsening of abdominal pains  Swelling of the abdomen that is new, acute  Fever of 100F or higher  For urgent or emergent issues, a gastroenterologist can be reached at any hour by calling [phone]. Do not use MyChart messaging for urgent concerns.     DIET:  We do recommend a small meal at first, but then you may proceed to your regular diet.  Drink plenty of fluids but you should avoid alcoholic beverages for 24 hours.  ACTIVITY:  You should plan to take it easy for the rest of today and you should NOT DRIVE or use heavy machinery until tomorrow (because of the sedation medicines used during the test).    FOLLOW UP: Our staff will call the number listed on your records the next business day following your procedure.  We will call around 7:15- 8:00 am to check on you and address any questions or concerns that you may have regarding the information given to you following your procedure. If we do not reach you, we will leave a message.     If any biopsies were taken you will be contacted by phone or by letter within the next 1-3 weeks.  Please call us at [phone] if you have not heard about the biopsies in 3 weeks.    SIGNATURES/CONFIDENTIALITY: You and/or your care partner have signed paperwork which will be entered into your electronic medical record.  These signatures attest to the fact that that the information above on your After Visit Summary has been reviewed and is understood.  Full responsibility of the confidentiality of this discharge information lies with you and/or your care-partner.

## 2023-04-10 NOTE — Progress Notes (Signed)
Pt's states no medical or surgical changes since previsit or office visit. 

## 2023-04-10 NOTE — Progress Notes (Signed)
History and Physical Interval Note:  04/10/2023 3:36 PM  Blake Curtis  has presented today for endoscopic procedure(s), with the diagnosis of  Encounter Diagnoses  Name Primary?   Colon cancer screening Yes   Chronic idiopathic constipation   .  The various methods of evaluation and treatment have been discussed with the patient and/or family. After consideration of risks, benefits and other options for treatment, the patient has consented to  the endoscopic procedure(s).   The patient's history has been reviewed, patient examined, no change in status, stable for endoscopic procedure(s).  I have reviewed the patient's chart and labs.  Questions were answered to the patient's satisfaction.     Iva Boop, MD, [facility]

## 2023-04-10 NOTE — Progress Notes (Signed)
Vss nad trans to pacu 

## 2023-04-13 ENCOUNTER — Telehealth: Payer: Self-pay

## 2023-04-13 NOTE — Telephone Encounter (Signed)
  Follow up Call-     04/10/2023    3:13 PM  Call back number  Post procedure Call Back phone  # [phone]  Permission to leave phone message Yes     Patient questions:  Do you have a fever, pain , or abdominal swelling? No. Pain Score  0 *  Have you tolerated food without any problems? Yes.    Have you been able to return to your normal activities? Yes.    Do you have any questions about your discharge instructions: Diet   No. Medications  No. Follow up visit  No.  Do you have questions or concerns about your Care? No.  Actions: * If pain score is 4 or above: No action needed, pain <4.

## 2023-04-15 LAB — SURGICAL PATHOLOGY

## 2023-04-16 ENCOUNTER — Ambulatory Visit: Payer: 59 | Admitting: Audiologist

## 2023-04-17 ENCOUNTER — Encounter: Payer: Self-pay | Admitting: Internal Medicine

## 2023-04-28 ENCOUNTER — Telehealth: Payer: Self-pay | Admitting: Pharmacist

## 2023-04-28 NOTE — Telephone Encounter (Signed)
Call to titrate Mounjaro dose, N/A, LVM and sent MyChart

## 2023-04-29 ENCOUNTER — Other Ambulatory Visit (HOSPITAL_BASED_OUTPATIENT_CLINIC_OR_DEPARTMENT_OTHER): Payer: Self-pay

## 2023-04-29 MED ORDER — MOUNJARO 5 MG/0.5ML ~~LOC~~ SOAJ
5.0000 mg | SUBCUTANEOUS | 0 refills | Status: DC
  Filled 2023-04-29: qty 2, 28d supply, fill #0

## 2023-04-29 NOTE — Addendum Note (Signed)
Addended by: Tylene Fantasia on: 04/29/2023 01:08 PM   Modules accepted: Orders

## 2023-05-29 ENCOUNTER — Encounter: Payer: Self-pay | Admitting: Pharmacist

## 2023-05-29 ENCOUNTER — Other Ambulatory Visit (HOSPITAL_BASED_OUTPATIENT_CLINIC_OR_DEPARTMENT_OTHER): Payer: Self-pay

## 2023-05-29 MED ORDER — MOUNJARO 7.5 MG/0.5ML ~~LOC~~ SOAJ
7.5000 mg | SUBCUTANEOUS | 0 refills | Status: DC
  Filled 2023-05-29: qty 2, 28d supply, fill #0

## 2023-05-29 NOTE — Telephone Encounter (Signed)
 Spoke to pt over the phone, suggest to take OTC laxative ( Miralax or senokot-S) for constipation. Lost 10 lbs so far FBG has improved so he has stopped taking metformin . Ready to move on to the next dose.   Prescription for Mounjaro  7.5 mg sent to [facility] pharmacy

## 2023-06-03 ENCOUNTER — Other Ambulatory Visit (HOSPITAL_BASED_OUTPATIENT_CLINIC_OR_DEPARTMENT_OTHER): Payer: Self-pay

## 2023-06-04 ENCOUNTER — Encounter: Payer: Self-pay | Admitting: Internal Medicine

## 2023-06-04 ENCOUNTER — Ambulatory Visit (INDEPENDENT_AMBULATORY_CARE_PROVIDER_SITE_OTHER): Payer: Commercial Managed Care - PPO | Admitting: Internal Medicine

## 2023-06-04 ENCOUNTER — Other Ambulatory Visit (HOSPITAL_BASED_OUTPATIENT_CLINIC_OR_DEPARTMENT_OTHER): Payer: Self-pay

## 2023-06-04 VITALS — BP 118/80 | HR 92 | Ht 72.5 in | Wt 249.0 lb

## 2023-06-04 DIAGNOSIS — K5904 Chronic idiopathic constipation: Secondary | ICD-10-CM | POA: Diagnosis not present

## 2023-06-04 NOTE — Patient Instructions (Signed)
 Continue Smooth Move Tea and or Miralax.  Stop the Amitiza per Dr Leone Payor.   Follow up with your primary care Doctor about medicines you are not taking.   I appreciate the opportunity to care for you. Stan Head, MD, [facility]

## 2023-06-04 NOTE — Progress Notes (Signed)
 Blake Curtis 63 y.o. 10-28-1960 [id number]  Assessment & Plan:   Encounter Diagnosis  Name Primary?   Chronic idiopathic constipation Yes   Smooth move tea and/or Miralax may be used as these are successful.  He will return here as needed.  He is advised to follow-up with primary care regarding his diabetes treatment as he has self discontinued several medications.  CC: [name], MD   Subjective:   Chief Complaint: Constipation  HPI 63 year old African-American man with a history of chronic constipation.  He was seen by [name] PA-C 03/23/2023 and set up for a screening colonoscopy, 2-day prep ordered, and Linzess  145 mcg prescribed.  She noted a history of prostate nodule and he was referred to urology he had a history of PSA elevation.  He has had follow-up with Dr. [name] on that he reports.  Colonoscopy 04/10/2023 - Two 1 to 2 mm polyps in the transverse colon and at the appendiceal orifice, removed with a cold biopsy forceps. Resected and retrieved. - The examination was otherwise normal on direct and retroflexion views. Impression: - Patient has a contact number available for emergencies. The signs and symptoms of potential delayed complications were discussed with the patient. Return to normal activities tomorrow. Written discharge instructions were provided to the patient. - Resume previous diet. - Continue present medications. - Await pathology results. - Repeat colonoscopy is recommended. The colonoscopy date will be determined after pathology results from today's exam become available for review. - He was given Linzess  samples but that is not on formulary Will try lubiprostone  24 ug bid - about to start Mojuaro so that could acffect defecation also. ADDENDUM - wife said they got Linzess  - it was covered apparently (computer says not on formulary) - but she wanted him to try lubiprostone  - to see if cheaper -  Polyps were lymphoid aggregate and inflammatory  granulation tissue polyp i.e. not precancerous so routine repeat plans for 2034   Today he says that he takes smooth move tea or he uses MiraLAX he has satisfactory results regarding constipation.  He is satisfied with using these.  He took Amitiza  for 5 days and did not get any benefit.  He has stopped taking his statin and some of his other diabetes medicines, he is now on Mounjaro  and says he is losing weight and his blood sugars are good. Allergies  Allergen Reactions   Losartan Cough   Current Meds  Medication Sig   aspirin EC 81 MG tablet Take 81 mg by mouth daily. Swallow whole.   tirzepatide  (MOUNJARO ) 7.5 MG/0.5ML Pen Inject 7.5 mg into the skin once a week.   Past Medical History:  Diagnosis Date   Diabetes mellitus without complication (HCC)    diet controlled   Hypertension    Sleep apnea    No CPAP   Past Surgical History:  Procedure Laterality Date   APPENDECTOMY  05/26/2000   COLONOSCOPY     TRANSURETHRAL RESECTION OF PROSTATE N/A 03/19/2020   Procedure: TRANSURETHRAL RESECTION OF THE PROSTATE (TURP) WITH CYSTOSCOPY;  Surgeon: [name] Glance, MD;  Location: [facility];  Service: Urology;  Laterality: N/A;  ANESTHESIA GENERAL OR SPINAL PER DR [name]   Social History   Social History Narrative   Lives at home with his wife.   Right-handed.   No daily caffeine use.    family history includes Diabetes in his mother and sister.   Review of Systems As per HPI  Objective:   Physical Exam BP 118/80  Pulse 92   Ht 6' 0.5 (1.842 m)   Wt 249 lb (112.9 kg)   BMI 33.31 kg/m

## 2023-06-08 ENCOUNTER — Other Ambulatory Visit (HOSPITAL_BASED_OUTPATIENT_CLINIC_OR_DEPARTMENT_OTHER): Payer: Self-pay

## 2023-06-09 ENCOUNTER — Telehealth: Payer: Self-pay | Admitting: Internal Medicine

## 2023-06-09 ENCOUNTER — Other Ambulatory Visit (HOSPITAL_BASED_OUTPATIENT_CLINIC_OR_DEPARTMENT_OTHER): Payer: Self-pay

## 2023-06-09 NOTE — Telephone Encounter (Signed)
 Pt c/o medication issue:  1. Name of Medication: tirzepatide  (MOUNJARO ) 7.5 MG/0.5ML Pen   2. How are you currently taking this medication (dosage and times per day)?    3. Are you having a reaction (difficulty breathing--STAT)? no  4. What is your medication issue? Calling to get ICD -10 code for insurance to process. Please advise

## 2023-06-10 NOTE — Telephone Encounter (Signed)
 Blake Curtis, PharmD has provided ICD 10 code to pharmacy on Jan 14,2025.

## 2023-06-27 ENCOUNTER — Other Ambulatory Visit: Payer: Self-pay | Admitting: Internal Medicine

## 2023-06-27 DIAGNOSIS — E119 Type 2 diabetes mellitus without complications: Secondary | ICD-10-CM

## 2023-06-29 ENCOUNTER — Other Ambulatory Visit (HOSPITAL_BASED_OUTPATIENT_CLINIC_OR_DEPARTMENT_OTHER): Payer: Self-pay

## 2023-06-29 ENCOUNTER — Telehealth: Payer: Self-pay | Admitting: Pharmacist

## 2023-06-29 MED ORDER — MOUNJARO 10 MG/0.5ML ~~LOC~~ SOAJ
10.0000 mg | SUBCUTANEOUS | 0 refills | Status: DC
  Filled 2023-06-29 – 2023-07-01 (×2): qty 2, 28d supply, fill #0

## 2023-06-29 NOTE — Telephone Encounter (Signed)
Tolerates Mounjaro 7.5 mg well. Will increase dose to 10 mg. Prescription sent to pharmacy.  Lost 20 lbs so far. F/u due in 4 weeks.

## 2023-07-01 ENCOUNTER — Other Ambulatory Visit (HOSPITAL_BASED_OUTPATIENT_CLINIC_OR_DEPARTMENT_OTHER): Payer: Self-pay

## 2023-07-01 DIAGNOSIS — E119 Type 2 diabetes mellitus without complications: Secondary | ICD-10-CM | POA: Diagnosis not present

## 2023-07-02 ENCOUNTER — Other Ambulatory Visit (HOSPITAL_BASED_OUTPATIENT_CLINIC_OR_DEPARTMENT_OTHER): Payer: Self-pay

## 2023-07-29 ENCOUNTER — Telehealth: Payer: Self-pay | Admitting: Pharmacist

## 2023-07-29 DIAGNOSIS — E119 Type 2 diabetes mellitus without complications: Secondary | ICD-10-CM

## 2023-07-29 NOTE — Telephone Encounter (Signed)
 MyChart sent for Mounjaro dose titration.

## 2023-07-31 ENCOUNTER — Other Ambulatory Visit (HOSPITAL_BASED_OUTPATIENT_CLINIC_OR_DEPARTMENT_OTHER): Payer: Self-pay

## 2023-07-31 MED ORDER — MOUNJARO 12.5 MG/0.5ML ~~LOC~~ SOAJ
12.5000 mg | SUBCUTANEOUS | 0 refills | Status: DC
  Filled 2023-07-31: qty 2, 28d supply, fill #0

## 2023-07-31 MED ORDER — TIRZEPATIDE 12.5 MG/0.5ML ~~LOC~~ SOAJ
12.5000 mg | SUBCUTANEOUS | 0 refills | Status: DC
  Filled 2023-07-31: qty 2, 28d supply, fill #0

## 2023-07-31 NOTE — Addendum Note (Signed)
 Addended by: Tylene Fantasia on: 07/31/2023 09:26 AM   Modules accepted: Orders

## 2023-07-31 NOTE — Addendum Note (Signed)
 Addended by: Tylene Fantasia on: 07/31/2023 09:38 AM   Modules accepted: Orders

## 2023-08-17 DIAGNOSIS — Z1212 Encounter for screening for malignant neoplasm of rectum: Secondary | ICD-10-CM | POA: Diagnosis not present

## 2023-08-17 DIAGNOSIS — H8121 Vestibular neuronitis, right ear: Secondary | ICD-10-CM | POA: Diagnosis not present

## 2023-08-17 DIAGNOSIS — I10 Essential (primary) hypertension: Secondary | ICD-10-CM | POA: Diagnosis not present

## 2023-08-17 DIAGNOSIS — G4730 Sleep apnea, unspecified: Secondary | ICD-10-CM | POA: Diagnosis not present

## 2023-08-17 DIAGNOSIS — E1169 Type 2 diabetes mellitus with other specified complication: Secondary | ICD-10-CM | POA: Diagnosis not present

## 2023-08-17 DIAGNOSIS — R413 Other amnesia: Secondary | ICD-10-CM | POA: Diagnosis not present

## 2023-08-17 DIAGNOSIS — N401 Enlarged prostate with lower urinary tract symptoms: Secondary | ICD-10-CM | POA: Diagnosis not present

## 2023-08-17 DIAGNOSIS — G3184 Mild cognitive impairment, so stated: Secondary | ICD-10-CM | POA: Diagnosis not present

## 2023-08-17 DIAGNOSIS — R5383 Other fatigue: Secondary | ICD-10-CM | POA: Diagnosis not present

## 2023-08-17 DIAGNOSIS — R634 Abnormal weight loss: Secondary | ICD-10-CM | POA: Diagnosis not present

## 2023-08-24 ENCOUNTER — Encounter: Payer: Self-pay | Admitting: Internal Medicine

## 2023-08-24 ENCOUNTER — Other Ambulatory Visit: Payer: Self-pay | Admitting: Internal Medicine

## 2023-08-24 DIAGNOSIS — I10 Essential (primary) hypertension: Secondary | ICD-10-CM

## 2023-08-24 DIAGNOSIS — R531 Weakness: Secondary | ICD-10-CM

## 2023-08-25 ENCOUNTER — Ambulatory Visit
Admission: RE | Admit: 2023-08-25 | Discharge: 2023-08-25 | Discharge status: Home / Self Care | Source: Ambulatory Visit | Attending: Internal Medicine | Admitting: Internal Medicine

## 2023-08-25 DIAGNOSIS — E042 Nontoxic multinodular goiter: Secondary | ICD-10-CM | POA: Diagnosis not present

## 2023-08-25 DIAGNOSIS — I10 Essential (primary) hypertension: Secondary | ICD-10-CM

## 2023-08-25 DIAGNOSIS — I6523 Occlusion and stenosis of bilateral carotid arteries: Secondary | ICD-10-CM | POA: Diagnosis not present

## 2023-08-25 DIAGNOSIS — R531 Weakness: Secondary | ICD-10-CM

## 2023-09-04 ENCOUNTER — Other Ambulatory Visit: Payer: Self-pay | Admitting: Internal Medicine

## 2023-09-04 DIAGNOSIS — E119 Type 2 diabetes mellitus without complications: Secondary | ICD-10-CM

## 2023-09-07 ENCOUNTER — Other Ambulatory Visit (HOSPITAL_BASED_OUTPATIENT_CLINIC_OR_DEPARTMENT_OTHER): Payer: Self-pay

## 2023-09-07 MED ORDER — MOUNJARO 12.5 MG/0.5ML ~~LOC~~ SOAJ
12.5000 mg | SUBCUTANEOUS | 5 refills | Status: DC
  Filled 2023-09-07: qty 2, 28d supply, fill #0
  Filled 2023-09-30: qty 2, 28d supply, fill #1
  Filled 2023-10-28: qty 2, 28d supply, fill #2
  Filled 2023-11-25: qty 2, 28d supply, fill #3
  Filled 2024-01-15 – 2024-01-27 (×2): qty 2, 28d supply, fill #4

## 2023-09-09 DIAGNOSIS — G3184 Mild cognitive impairment, so stated: Secondary | ICD-10-CM | POA: Diagnosis not present

## 2023-09-09 DIAGNOSIS — Z0000 Encounter for general adult medical examination without abnormal findings: Secondary | ICD-10-CM | POA: Diagnosis not present

## 2023-09-09 DIAGNOSIS — E1169 Type 2 diabetes mellitus with other specified complication: Secondary | ICD-10-CM | POA: Diagnosis not present

## 2023-09-09 DIAGNOSIS — G4730 Sleep apnea, unspecified: Secondary | ICD-10-CM | POA: Diagnosis not present

## 2023-09-09 DIAGNOSIS — Z1331 Encounter for screening for depression: Secondary | ICD-10-CM | POA: Diagnosis not present

## 2023-09-09 DIAGNOSIS — R82998 Other abnormal findings in urine: Secondary | ICD-10-CM | POA: Diagnosis not present

## 2023-09-09 DIAGNOSIS — D869 Sarcoidosis, unspecified: Secondary | ICD-10-CM | POA: Diagnosis not present

## 2023-09-09 DIAGNOSIS — I10 Essential (primary) hypertension: Secondary | ICD-10-CM | POA: Diagnosis not present

## 2023-09-09 DIAGNOSIS — H8121 Vestibular neuronitis, right ear: Secondary | ICD-10-CM | POA: Diagnosis not present

## 2023-09-09 DIAGNOSIS — N401 Enlarged prostate with lower urinary tract symptoms: Secondary | ICD-10-CM | POA: Diagnosis not present

## 2023-09-09 DIAGNOSIS — R972 Elevated prostate specific antigen [PSA]: Secondary | ICD-10-CM | POA: Diagnosis not present

## 2023-09-30 ENCOUNTER — Other Ambulatory Visit: Payer: Self-pay

## 2023-10-08 DIAGNOSIS — Z7689 Persons encountering health services in other specified circumstances: Secondary | ICD-10-CM | POA: Diagnosis not present

## 2023-10-18 ENCOUNTER — Encounter: Payer: Self-pay | Admitting: Neurology

## 2023-10-22 ENCOUNTER — Telehealth: Payer: Self-pay | Admitting: Neurology

## 2023-10-22 NOTE — Telephone Encounter (Signed)
 Pt stopped in to sign med release form and Blake Curtis printed out his records and he took with him

## 2023-10-27 ENCOUNTER — Encounter: Payer: Self-pay | Admitting: Neurology

## 2023-12-02 DIAGNOSIS — E1142 Type 2 diabetes mellitus with diabetic polyneuropathy: Secondary | ICD-10-CM | POA: Diagnosis not present

## 2023-12-02 DIAGNOSIS — R2990 Unspecified symptoms and signs involving the nervous system: Secondary | ICD-10-CM | POA: Diagnosis not present

## 2023-12-02 DIAGNOSIS — G3184 Mild cognitive impairment, so stated: Secondary | ICD-10-CM | POA: Diagnosis not present

## 2023-12-02 DIAGNOSIS — E1169 Type 2 diabetes mellitus with other specified complication: Secondary | ICD-10-CM | POA: Diagnosis not present

## 2023-12-02 DIAGNOSIS — I10 Essential (primary) hypertension: Secondary | ICD-10-CM | POA: Diagnosis not present

## 2023-12-09 DIAGNOSIS — R3912 Poor urinary stream: Secondary | ICD-10-CM | POA: Diagnosis not present

## 2023-12-09 DIAGNOSIS — N401 Enlarged prostate with lower urinary tract symptoms: Secondary | ICD-10-CM | POA: Diagnosis not present

## 2023-12-16 DIAGNOSIS — N403 Nodular prostate with lower urinary tract symptoms: Secondary | ICD-10-CM | POA: Diagnosis not present

## 2023-12-16 DIAGNOSIS — N401 Enlarged prostate with lower urinary tract symptoms: Secondary | ICD-10-CM | POA: Diagnosis not present

## 2023-12-16 DIAGNOSIS — R3912 Poor urinary stream: Secondary | ICD-10-CM | POA: Diagnosis not present

## 2023-12-16 DIAGNOSIS — R972 Elevated prostate specific antigen [PSA]: Secondary | ICD-10-CM | POA: Diagnosis not present

## 2023-12-23 ENCOUNTER — Other Ambulatory Visit (HOSPITAL_BASED_OUTPATIENT_CLINIC_OR_DEPARTMENT_OTHER): Payer: Self-pay

## 2023-12-23 DIAGNOSIS — I6381 Other cerebral infarction due to occlusion or stenosis of small artery: Secondary | ICD-10-CM | POA: Diagnosis not present

## 2023-12-23 DIAGNOSIS — G309 Alzheimer's disease, unspecified: Secondary | ICD-10-CM | POA: Diagnosis not present

## 2023-12-23 DIAGNOSIS — R42 Dizziness and giddiness: Secondary | ICD-10-CM | POA: Diagnosis not present

## 2023-12-23 DIAGNOSIS — G3184 Mild cognitive impairment, so stated: Secondary | ICD-10-CM | POA: Diagnosis not present

## 2023-12-23 DIAGNOSIS — Z818 Family history of other mental and behavioral disorders: Secondary | ICD-10-CM | POA: Diagnosis not present

## 2023-12-23 DIAGNOSIS — F0280 Dementia in other diseases classified elsewhere without behavioral disturbance: Secondary | ICD-10-CM | POA: Diagnosis not present

## 2023-12-23 DIAGNOSIS — Z1331 Encounter for screening for depression: Secondary | ICD-10-CM | POA: Diagnosis not present

## 2023-12-23 MED ORDER — MEMANTINE HCL 5 MG PO TABS
5.0000 mg | ORAL_TABLET | Freq: Two times a day (BID) | ORAL | 0 refills | Status: AC
  Filled 2023-12-23: qty 60, 30d supply, fill #0

## 2023-12-23 MED ORDER — MEMANTINE HCL 10 MG PO TABS
10.0000 mg | ORAL_TABLET | Freq: Two times a day (BID) | ORAL | 3 refills | Status: DC
  Filled 2023-12-23 – 2024-01-15 (×2): qty 180, 90d supply, fill #0

## 2023-12-23 MED ORDER — ATORVASTATIN CALCIUM 40 MG PO TABS
40.0000 mg | ORAL_TABLET | Freq: Every day | ORAL | 3 refills | Status: DC
  Filled 2023-12-23 (×2): qty 90, 90d supply, fill #0

## 2023-12-29 ENCOUNTER — Encounter: Payer: Self-pay | Admitting: Internal Medicine

## 2024-01-05 ENCOUNTER — Other Ambulatory Visit (HOSPITAL_COMMUNITY): Payer: Self-pay | Admitting: Geriatric Medicine

## 2024-01-05 DIAGNOSIS — G3184 Mild cognitive impairment, so stated: Secondary | ICD-10-CM

## 2024-01-05 DIAGNOSIS — F0280 Dementia in other diseases classified elsewhere without behavioral disturbance: Secondary | ICD-10-CM

## 2024-01-08 ENCOUNTER — Encounter (HOSPITAL_COMMUNITY)
Admission: RE | Admit: 2024-01-08 | Discharge: 2024-01-08 | Disposition: A | Discharge status: Home / Self Care | Source: Ambulatory Visit | Attending: Geriatric Medicine | Admitting: Geriatric Medicine

## 2024-01-08 DIAGNOSIS — G3184 Mild cognitive impairment, so stated: Secondary | ICD-10-CM | POA: Diagnosis not present

## 2024-01-08 DIAGNOSIS — G309 Alzheimer's disease, unspecified: Secondary | ICD-10-CM | POA: Insufficient documentation

## 2024-01-08 DIAGNOSIS — F0280 Dementia in other diseases classified elsewhere without behavioral disturbance: Secondary | ICD-10-CM | POA: Diagnosis not present

## 2024-01-08 MED ORDER — FLORBETAPIR F 18 500-1900 MBQ/ML IV SOLN
10.0600 | Freq: Once | INTRAVENOUS | Status: AC
  Administered 2024-01-08: 10.06 via INTRAVENOUS

## 2024-01-12 NOTE — Progress Notes (Unsigned)
 Cardiology Office Note:   Date:  01/13/2024  ID:  Blake Curtis, DOB [birth year], MRN [id number] PCP:  [name], MD  [facility] Providers Cardiologist:  Wendel Haws, MD Referring MD: [name], MD  Chief Complaint/Reason for Referral: Cardiology follow-up for chest pain ASSESSMENT:    1. Precordial pain   2. Type 2 diabetes mellitus without complication, without long-term current use of insulin  (HCC)   3. Hypertension associated with diabetes (HCC)   4. Hyperlipidemia associated with type 2 diabetes mellitus (HCC)   5. Aortic atherosclerosis (HCC)   6. CKD (chronic kidney disease) stage 2, GFR 60-89 ml/min   7. BMI 33.0-33.9,adult     PLAN:   In order of problems listed above: Chest pain: Reassuring evaluation with low risk stress test and unremarkable echocardiogram. T2DM: Continue aspirin 81 mg, atorvastatin  40 mg, patient defers Jardiance , defer ACE or ARB due to intolerance Hypertension: BP is well-controlled today. Hyperlipidemia: Continue atorvastatin  40 mg; check lipid panel LFTs, LP(a) today Aortic atherosclerosis: Continue aspirin 81 mg, atorvastatin  40 mg CKD stage II: Continue to monitor; patient defers Jardiance   Elevated BMI: Patient currently off Mounjaro ; may restart after speaking with PCP.            Dispo:  Return in about 6 months (around 07/15/2024).       I spent 38 minutes reviewing all clinical data during and prior to this visit including all relevant imaging studies, laboratories, clinical information from other health systems and prior notes from both Cardiology and other specialties, interviewing the patient, conducting a complete physical examination, and coordinating care in order to formulate a comprehensive and personalized evaluation and treatment plan.   History of Present Illness:    FOCUSED PROBLEM LIST:   Type 2 diabetes not on insulin  Defers SLGT2i Hypertension Intolerant of losartan Hyperlipidemia Aortic  atherosclerosis CT 2019 Chest pain ETT low risk 2023 EF 55 to 60%, no significant valvular abnormalities TTE 2023 CKD stage II GERD, BMI 33 Vestibular neuritis Followed by neurology   April 2023:  The patient is a 63 y.o. male with the indicated medical history here for recommendations regarding chest pain.  The patient was seen by his primary care provider recently.  The patient's PCP noted that the patient had fleeting chest pain at rest.  He tells me that he occasionally gets it when he mows his lawn.  This happens maybe once or twice a month.  Sometimes stretching out his chest seems to help.  He denies any significant shortness of breath, bleeding, signs or symptoms stroke, paroxysmal nocturnal dyspnea, orthopnea, presyncope, or syncope.Blake Curtis  He is required no emergency room visits or hospitalizations.  He is primary care provider recently started him on Benicar  for improved blood pressure control.  He tells me his blood sugars are relatively well controlled at home.  Plan: Obtain exercise treadmill stress test and echocardiogram; start aspirin 81 mg, atorvastatin  40 mg consider SGLT2 inhibitor in the future, and check lipid panel LFTs in 2 months.   October 2024: The patient returns for routine follow-up.  In the interim he had an exercise treadmill stress test which was low risk for ischemia.  Echocardiogram demonstrated normal LV function with no significant valvular abnormalities.  His LDL in the interim was 48.  He was also diagnosed with vestibular neuritis and mild cognitive impairment and is being followed by neurology.   He continues to be plagued by vestibular issues.  He has been checking his blood pressures at home and they are  fairly well-controlled.  He denies any orthostasis.  He denies any chest pain or shortness of breath.  He has had no peripheral edema or paroxysmal nocturnal dyspnea.  He has required no emergency room visits or hospitalizations for cardiovascular issues per se  but did have an emergency room visit for lightheadedness leading to his neurologic evaluation.  Plan: Start Jardiance  10 mg, atorvastatin  40 mg, refer to pharmacy for GLP-1 receptor agonist therapy, check lipid panel LFTs in 2 months.  August 2025:  Patient consents to use of AI scribe. Patient never had his lipid panel drawn.  He was seen by pharmacy and started on Mounjaro .  He lost quite a bit of weight nearly 60 pounds.  His PCP has had him hold Mounjaro  and has a follow-up visit with him to discuss further.  The patient also decided to stop Jardiance  because he did not think he had a need for it.  He otherwise feels well.  He denies any cardiovascular complaints.  Specifically he denies any chest pain, severe shortness of breath, palpitations, paroxysmal nocturnal dyspnea, orthopnea.  He has not required any emergency room visits or hospitalizations.  He is otherwise well without significant complaints today.       Current Medications: Current Meds  Medication Sig   aspirin EC 81 MG tablet Take 81 mg by mouth daily. Swallow whole.   atorvastatin  (LIPITOR) 40 MG tablet Take 1 tablet (40 mg total) by mouth daily.   memantine  (NAMENDA ) 5 MG tablet Take 1 tablet (5 mg total) by mouth 2 (two) times daily     Review of Systems:   Please see the history of present illness.    All other systems reviewed and are negative.     EKGs/Labs/Other Test Reviewed:   EKG: 2024 normal sinus rhythm  EKG Interpretation Date/Time:    Ventricular Rate:    PR Interval:    QRS Duration:    QT Interval:    QTC Calculation:   R Axis:      Text Interpretation:          CARDIAC STUDIES: Refer to CV Procedures and Imaging Tabs   Risk Assessment/Calculations:          Physical Exam:   VS:  BP 118/66   Pulse 69   Ht 6' 2 (1.88 m)   Wt 217 lb 9.6 oz (98.7 kg)   SpO2 98%   BMI 27.94 kg/m        Wt Readings from Last 3 Encounters:  01/13/24 217 lb 9.6 oz (98.7 kg)  06/04/23 249 lb (112.9  kg)  04/10/23 263 lb (119.3 kg)      GENERAL:  No apparent distress, AOx3 HEENT:  No carotid bruits, +2 carotid impulses, no scleral icterus CAR: RRR no murmurs, gallops, rubs, or thrills RES:  Clear to auscultation bilaterally ABD:  Soft, nontender, nondistended, positive bowel sounds x 4 VASC:  +2 radial pulses, +2 carotid pulses NEURO:  CN 2-12 grossly intact; motor and sensory grossly intact PSYCH:  No active depression or anxiety EXT:  No edema, ecchymosis, or cyanosis  Signed, Jahred Tatar K Deny Chevez, MD  01/13/2024 11:38 AM    [facility] [address] [place], [place], [place]  [address] Phone: [phone]; Fax: [phone]   Note:  This document was prepared using Dragon voice recognition software and may include unintentional dictation errors.

## 2024-01-13 ENCOUNTER — Ambulatory Visit: Attending: Internal Medicine | Admitting: Internal Medicine

## 2024-01-13 ENCOUNTER — Encounter: Payer: Self-pay | Admitting: Internal Medicine

## 2024-01-13 VITALS — BP 118/66 | HR 69 | Ht 74.0 in | Wt 217.6 lb

## 2024-01-13 DIAGNOSIS — I700 Atherosclerosis of aorta: Secondary | ICD-10-CM

## 2024-01-13 DIAGNOSIS — E119 Type 2 diabetes mellitus without complications: Secondary | ICD-10-CM | POA: Diagnosis not present

## 2024-01-13 DIAGNOSIS — I152 Hypertension secondary to endocrine disorders: Secondary | ICD-10-CM | POA: Diagnosis not present

## 2024-01-13 DIAGNOSIS — Z6833 Body mass index (BMI) 33.0-33.9, adult: Secondary | ICD-10-CM | POA: Diagnosis not present

## 2024-01-13 DIAGNOSIS — E1169 Type 2 diabetes mellitus with other specified complication: Secondary | ICD-10-CM | POA: Diagnosis not present

## 2024-01-13 DIAGNOSIS — N182 Chronic kidney disease, stage 2 (mild): Secondary | ICD-10-CM | POA: Diagnosis not present

## 2024-01-13 DIAGNOSIS — R072 Precordial pain: Secondary | ICD-10-CM

## 2024-01-13 DIAGNOSIS — E785 Hyperlipidemia, unspecified: Secondary | ICD-10-CM

## 2024-01-13 DIAGNOSIS — E1159 Type 2 diabetes mellitus with other circulatory complications: Secondary | ICD-10-CM | POA: Diagnosis not present

## 2024-01-13 NOTE — Patient Instructions (Addendum)
 Medication Instructions:  Your physician recommends that you continue on your current medications as directed. Please refer to the Current Medication list given to you today.  *If you need a refill on your cardiac medications before your next appointment, please call your pharmacy*  Lab Work: Your physician recommends that you return for FASTING lab work:  Lipid profile, LipoproteinA  If you have any lab test that is abnormal or we need to change your treatment, we will call you to review the results.  Testing/Procedures: None ordered  Follow-Up: At [facility], you and your health needs are our priority.  As part of our continuing mission to provide you with exceptional heart care, our providers are all part of one team.  This team includes your primary Cardiologist (physician) and Advanced Practice Providers or APPs (Physician Assistants and Nurse Practitioners) who all work together to provide you with the care you need, when you need it.  Your next appointment:   6 month(s)  Provider:   One of our Advanced Practice Providers (APPs): Morse Clause, PA-C  Lamarr Satterfield, NP Miriam Shams, NP  Olivia Pavy, PA-C Josefa Beauvais, NP  Leontine Salen, PA-C Orren Fabry, PA-C  [place], PA-C Ernest Dick, NP  Damien Braver, NP Jon Hails, PA-C  Waddell Donath, PA-C    Dayna Dunn, PA-C  Scott Weaver, PA-C Lum Louis, NP Katlyn West, NP Callie Goodrich, PA-C  Evan Williams, PA-C Sheng Haley, PA-C  Xika Zhao, NP Kathleen Johnson, PA-C    Thank you for choosing  [facility]!!   [phone]

## 2024-01-15 ENCOUNTER — Other Ambulatory Visit: Payer: Self-pay

## 2024-01-15 ENCOUNTER — Other Ambulatory Visit (HOSPITAL_BASED_OUTPATIENT_CLINIC_OR_DEPARTMENT_OTHER): Payer: Self-pay

## 2024-01-16 ENCOUNTER — Ambulatory Visit: Payer: Self-pay | Admitting: Internal Medicine

## 2024-01-18 DIAGNOSIS — H8190 Unspecified disorder of vestibular function, unspecified ear: Secondary | ICD-10-CM | POA: Diagnosis not present

## 2024-01-18 DIAGNOSIS — G3184 Mild cognitive impairment, so stated: Secondary | ICD-10-CM | POA: Diagnosis not present

## 2024-01-18 DIAGNOSIS — E782 Mixed hyperlipidemia: Secondary | ICD-10-CM | POA: Diagnosis not present

## 2024-01-18 DIAGNOSIS — E1169 Type 2 diabetes mellitus with other specified complication: Secondary | ICD-10-CM | POA: Diagnosis not present

## 2024-01-18 DIAGNOSIS — I10 Essential (primary) hypertension: Secondary | ICD-10-CM | POA: Diagnosis not present

## 2024-01-18 LAB — HEPATIC FUNCTION PANEL
ALT: 29 IU/L (ref 0–44)
AST: 30 IU/L (ref 0–40)
Albumin: 4.2 g/dL (ref 3.9–4.9)
Alkaline Phosphatase: 94 IU/L (ref 44–121)
Bilirubin Total: 0.5 mg/dL (ref 0.0–1.2)
Bilirubin, Direct: 0.21 mg/dL (ref 0.00–0.40)
Total Protein: 6.2 g/dL (ref 6.0–8.5)

## 2024-01-18 LAB — LIPID PANEL
Chol/HDL Ratio: 1.6 ratio (ref 0.0–5.0)
Cholesterol, Total: 162 mg/dL (ref 100–199)
HDL: 99 mg/dL (ref >39)
LDL Chol Calc (NIH): 52 mg/dL (ref 0–99)
Triglycerides: 55 mg/dL (ref 0–149)
VLDL Cholesterol Cal: 11 mg/dL (ref 5–40)

## 2024-01-18 LAB — LIPOPROTEIN A (LPA): Lipoprotein (a): 140 nmol/L — AB (ref <75.0)

## 2024-01-26 ENCOUNTER — Other Ambulatory Visit (HOSPITAL_BASED_OUTPATIENT_CLINIC_OR_DEPARTMENT_OTHER): Payer: Self-pay

## 2024-01-27 ENCOUNTER — Other Ambulatory Visit (HOSPITAL_BASED_OUTPATIENT_CLINIC_OR_DEPARTMENT_OTHER): Payer: Self-pay

## 2024-02-09 ENCOUNTER — Encounter: Payer: Self-pay | Admitting: Internal Medicine

## 2024-02-09 DIAGNOSIS — E1169 Type 2 diabetes mellitus with other specified complication: Secondary | ICD-10-CM

## 2024-02-10 ENCOUNTER — Other Ambulatory Visit (HOSPITAL_BASED_OUTPATIENT_CLINIC_OR_DEPARTMENT_OTHER): Payer: Self-pay

## 2024-02-10 MED ORDER — ROSUVASTATIN CALCIUM 20 MG PO TABS
20.0000 mg | ORAL_TABLET | Freq: Every day | ORAL | 3 refills | Status: DC
  Filled 2024-02-10: qty 30, 30d supply, fill #0
  Filled 2024-05-13: qty 30, 30d supply, fill #1

## 2024-02-12 ENCOUNTER — Other Ambulatory Visit (HOSPITAL_BASED_OUTPATIENT_CLINIC_OR_DEPARTMENT_OTHER): Payer: Self-pay

## 2024-02-12 ENCOUNTER — Encounter: Payer: Self-pay | Admitting: Internal Medicine

## 2024-02-17 ENCOUNTER — Telehealth: Payer: Self-pay | Admitting: Pharmacist

## 2024-02-17 MED ORDER — ROSUVASTATIN CALCIUM 10 MG PO TABS: 10.0000 mg | ORAL_TABLET | Freq: Every day | ORAL | Status: AC

## 2024-02-17 NOTE — Telephone Encounter (Signed)
 Spoke to pt, advised to reduce rosuvastatin  dose to 10 mg daily or 10 mg eery other day. Will be seeing him in the office on 03/02/24 to discuss other lipid lowering agents

## 2024-03-02 ENCOUNTER — Ambulatory Visit: Attending: Cardiology | Admitting: Pharmacist

## 2024-03-02 ENCOUNTER — Encounter: Payer: Self-pay | Admitting: Pharmacist

## 2024-03-02 DIAGNOSIS — E785 Hyperlipidemia, unspecified: Secondary | ICD-10-CM

## 2024-03-02 NOTE — Progress Notes (Signed)
 Patient ID: Blake Curtis                 DOB: February 03, 1961                    MRN: [id number]      HPI: Blake Curtis is a 63 y.o. male patient referred to lipid clinic by Dr.Thukkani. PMH is significant for T2DM, HLD, OSA.  Patient was presented today for lipid clinic and the wife was on video call. Patient is on Rosuvastatin  10 mg daily. He thinks it is causing dizziness so he had stopped taking it. Otherwise he can tolerate statin well and his LDLc was at goal when he was on 10 mg rosuvastatin . From past 4 weeks he has stopped taking statin and he takes extra virgin olive oil with  lemon juice that has helped with dizziness   Reiterated importance of statins and discussed common side effects  Current Medications: rosuvastatin  10 mg daily  Intolerances: Crestor  - dizziness, lack of energy and constipation, Lipitor - dizziness  Risk Factors: T2DM, HTN, elevated Lpa, CVA - 2 times  LDL goal: <70  Last lab 01/15/2024 TC 162, TG 55, HDL 99, LDLc 52   Diet: heart healthy   Social History:  Alcohol : none  Smoking: none  Labs:  Lipid Panel     Component Value Date/Time   CHOL 162 01/15/2024 0823   TRIG 55 01/15/2024 0823   HDL 99 01/15/2024 0823   CHOLHDL 1.6 01/15/2024 0823   LDLCALC 52 01/15/2024 0823   LABVLDL 11 01/15/2024 0823    Past Medical History:  Diagnosis Date   Diabetes mellitus without complication (HCC)    diet controlled   Hypertension    Sleep apnea    No CPAP    Current Outpatient Medications on File Prior to Visit  Medication Sig Dispense Refill   aspirin EC 81 MG tablet Take 81 mg by mouth daily. Swallow whole.     empagliflozin  (JARDIANCE ) 10 MG TABS tablet Take 1 tablet (10 mg total) by mouth daily before breakfast. (Patient not taking: Reported on 01/13/2024) 30 tablet 6   memantine  (NAMENDA ) 10 MG tablet Take 1 tablet (10 mg total) by mouth 2 (two) times daily. (Patient not taking: Reported on 01/13/2024) 180 tablet 3   memantine  (NAMENDA ) 5 MG tablet Take 1  tablet (5 mg total) by mouth 2 (two) times daily 60 tablet 0   rosuvastatin  (CRESTOR ) 10 MG tablet Take 1 tablet (10 mg total) by mouth daily.     tirzepatide  (MOUNJARO ) 12.5 MG/0.5ML Pen Inject 12.5 mg into the skin once a week. (Patient not taking: Reported on 01/13/2024) 2 mL 5   No current facility-administered medications on file prior to visit.    Allergies  Allergen Reactions   Lipitor [Atorvastatin ] Other (See Comments)    Dizziness    Losartan Cough    Assessment/Plan:  1. Hyperlipidemia -  Assessment and plan:  LDLc was below 55 when pt was on Crestor  10 mg daily. Patient stopped taking Crestor  from past 4 weeks. He thinks it causing dizziness. His neurologist has assured him Namenda  was causing dizziness. Pt wants to get baseline off the statin. Will get updated lipid lab tomorrow and pt to restart Crestor  10 mg daily. F/u lipid lab and LFT due mid Jan      Thank you,  Blake Curtis, [place].D Blake Curtis. [facility] [address], [place], [place] [address] Phone: [phone])  [phone]; Fax: [phone]

## 2024-03-04 ENCOUNTER — Ambulatory Visit: Payer: Self-pay | Admitting: Pharmacist

## 2024-03-04 LAB — LIPID PANEL
Chol/HDL Ratio: 1.9 ratio (ref 0.0–5.0)
Cholesterol, Total: 174 mg/dL (ref 100–199)
HDL: 92 mg/dL (ref >39)
LDL Chol Calc (NIH): 72 mg/dL (ref 0–99)
Triglycerides: 51 mg/dL (ref 0–149)
VLDL Cholesterol Cal: 10 mg/dL (ref 5–40)

## 2024-03-04 NOTE — Telephone Encounter (Signed)
 Lab result discussed over the phone. LDL was at goal when pt was taking Crestor  10 mg. He had stopped it due to dizziness. Discussed s/e like dizziness unlikely caused by statin so he will restart Crestor  10 mg daily. Will repeat lab mid Jan.

## 2024-03-09 DIAGNOSIS — Z23 Encounter for immunization: Secondary | ICD-10-CM | POA: Diagnosis not present

## 2024-03-09 DIAGNOSIS — G3184 Mild cognitive impairment, so stated: Secondary | ICD-10-CM | POA: Diagnosis not present

## 2024-03-09 DIAGNOSIS — E1169 Type 2 diabetes mellitus with other specified complication: Secondary | ICD-10-CM | POA: Diagnosis not present

## 2024-03-09 DIAGNOSIS — E782 Mixed hyperlipidemia: Secondary | ICD-10-CM | POA: Diagnosis not present

## 2024-03-17 ENCOUNTER — Telehealth: Payer: Self-pay | Admitting: Pharmacy Technician

## 2024-03-17 NOTE — Telephone Encounter (Signed)
 Patients active PA on file will not let me renew until November

## 2024-04-05 DIAGNOSIS — G3184 Mild cognitive impairment, so stated: Secondary | ICD-10-CM | POA: Diagnosis not present

## 2024-04-05 DIAGNOSIS — R42 Dizziness and giddiness: Secondary | ICD-10-CM | POA: Diagnosis not present

## 2024-04-05 DIAGNOSIS — Z1589 Genetic susceptibility to other disease: Secondary | ICD-10-CM | POA: Diagnosis not present

## 2024-04-05 DIAGNOSIS — I6381 Other cerebral infarction due to occlusion or stenosis of small artery: Secondary | ICD-10-CM | POA: Diagnosis not present

## 2024-04-19 ENCOUNTER — Other Ambulatory Visit (HOSPITAL_BASED_OUTPATIENT_CLINIC_OR_DEPARTMENT_OTHER): Payer: Self-pay

## 2024-05-13 ENCOUNTER — Other Ambulatory Visit (HOSPITAL_BASED_OUTPATIENT_CLINIC_OR_DEPARTMENT_OTHER): Payer: Self-pay

## 2024-05-13 ENCOUNTER — Other Ambulatory Visit (HOSPITAL_COMMUNITY): Payer: Self-pay

## 2024-05-13 ENCOUNTER — Other Ambulatory Visit: Payer: Self-pay

## 2024-05-13 MED ORDER — ATORVASTATIN CALCIUM 40 MG PO TABS
40.0000 mg | ORAL_TABLET | Freq: Every day | ORAL | 3 refills | Status: DC
  Filled 2024-05-13 (×2): qty 90, 90d supply, fill #0

## 2024-05-13 MED ORDER — MEMANTINE HCL 5 MG PO TABS
5.0000 mg | ORAL_TABLET | Freq: Two times a day (BID) | ORAL | 3 refills | Status: AC
  Filled 2024-05-13 (×2): qty 180, 90d supply, fill #0

## 2024-05-23 ENCOUNTER — Other Ambulatory Visit (HOSPITAL_BASED_OUTPATIENT_CLINIC_OR_DEPARTMENT_OTHER): Payer: Self-pay

## 2024-05-23 MED ORDER — OSELTAMIVIR PHOSPHATE 75 MG PO CAPS
75.0000 mg | ORAL_CAPSULE | Freq: Two times a day (BID) | ORAL | 0 refills | Status: AC
  Filled 2024-05-23: qty 10, 5d supply, fill #0

## 2024-05-25 ENCOUNTER — Other Ambulatory Visit (HOSPITAL_BASED_OUTPATIENT_CLINIC_OR_DEPARTMENT_OTHER): Payer: Self-pay

## 2024-05-25 MED ORDER — TAMSULOSIN HCL 0.4 MG PO CAPS
0.4000 mg | ORAL_CAPSULE | Freq: Every day | ORAL | 0 refills | Status: AC
  Filled 2024-05-25: qty 30, 30d supply, fill #0

## 2024-06-03 ENCOUNTER — Other Ambulatory Visit: Payer: Self-pay | Admitting: Internal Medicine

## 2024-06-03 ENCOUNTER — Ambulatory Visit
Admission: RE | Admit: 2024-06-03 | Discharge: 2024-06-03 | Disposition: A | Source: Ambulatory Visit | Attending: Internal Medicine | Admitting: Internal Medicine

## 2024-06-03 DIAGNOSIS — R319 Hematuria, unspecified: Secondary | ICD-10-CM
